# Patient Record
Sex: Male | Born: 2000 | Hispanic: Yes | Marital: Single | Smoking: Former smoker
Health system: Southern US, Community
[De-identification: ages and names within clinical notes are randomized; demographics above are authoritative.]

## PROBLEM LIST (undated history)

## (undated) HISTORY — PX: WRIST FRACTURE SURGERY: SHX121

## (undated) HISTORY — PX: ELBOW SURGERY: SHX618

---
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
## 2023-07-19 ENCOUNTER — Emergency Department (HOSPITAL_COMMUNITY): Payer: Self-pay | Admitting: Anesthesiology

## 2023-07-19 ENCOUNTER — Inpatient Hospital Stay: Admit: 2023-07-19 | Payer: Self-pay | Admitting: Surgery

## 2023-07-19 ENCOUNTER — Encounter (HOSPITAL_COMMUNITY): Payer: Self-pay | Admitting: General Surgery

## 2023-07-19 ENCOUNTER — Other Ambulatory Visit: Payer: Self-pay

## 2023-07-19 ENCOUNTER — Emergency Department (HOSPITAL_COMMUNITY): Payer: Self-pay

## 2023-07-19 ENCOUNTER — Inpatient Hospital Stay (HOSPITAL_COMMUNITY)
Admission: EM | Admit: 2023-07-19 | Discharge: 2023-07-23 | DRG: 399 | Disposition: A | Discharge status: Home / Self Care | Payer: Self-pay | Attending: Surgery | Admitting: Surgery

## 2023-07-19 ENCOUNTER — Encounter (HOSPITAL_COMMUNITY): Admission: EM | Disposition: A | Discharge status: Home / Self Care | Payer: Self-pay | Source: Home / Self Care

## 2023-07-19 DIAGNOSIS — K3580 Unspecified acute appendicitis: Secondary | ICD-10-CM | POA: Diagnosis present

## 2023-07-19 DIAGNOSIS — K381 Appendicular concretions: Secondary | ICD-10-CM | POA: Diagnosis present

## 2023-07-19 DIAGNOSIS — K352 Acute appendicitis with generalized peritonitis, without perforation or abscess: Principal | ICD-10-CM | POA: Diagnosis present

## 2023-07-19 DIAGNOSIS — R5082 Postprocedural fever: Secondary | ICD-10-CM | POA: Diagnosis not present

## 2023-07-19 HISTORY — PX: LAPAROSCOPIC APPENDECTOMY: SHX408

## 2023-07-19 LAB — COMPREHENSIVE METABOLIC PANEL
ALT: 23 U/L (ref 0–44)
AST: 23 U/L (ref 15–41)
Albumin: 4.8 g/dL (ref 3.5–5.0)
Alkaline Phosphatase: 83 U/L (ref 38–126)
Anion gap: 13 (ref 5–15)
BUN: 21 mg/dL — ABNORMAL HIGH (ref 6–20)
CO2: 25 mmol/L (ref 22–32)
Calcium: 9.7 mg/dL (ref 8.9–10.3)
Chloride: 97 mmol/L — ABNORMAL LOW (ref 98–111)
Creatinine, Ser: 1.2 mg/dL (ref 0.61–1.24)
GFR, Estimated: >60 mL/min (ref >60)
Glucose, Bld: 119 mg/dL — ABNORMAL HIGH (ref 70–99)
Potassium: 3.7 mmol/L (ref 3.5–5.1)
Sodium: 135 mmol/L (ref 135–145)
Total Bilirubin: 0.8 mg/dL (ref 0.0–1.2)
Total Protein: 9.2 g/dL — ABNORMAL HIGH (ref 6.5–8.1)

## 2023-07-19 LAB — LIPASE, BLOOD: Lipase: 50 U/L (ref 11–51)

## 2023-07-19 LAB — URINALYSIS, ROUTINE W REFLEX MICROSCOPIC
Bacteria, UA: NONE SEEN
Bilirubin Urine: NEGATIVE
Glucose, UA: NEGATIVE mg/dL
Hgb urine dipstick: NEGATIVE
Ketones, ur: 5 mg/dL — AB
Leukocytes,Ua: NEGATIVE
Nitrite: NEGATIVE
Protein, ur: >=300 mg/dL — AB
Specific Gravity, Urine: 1.031 — ABNORMAL HIGH (ref 1.005–1.030)
pH: 5 (ref 5.0–8.0)

## 2023-07-19 LAB — CBC
HCT: 44.9 % (ref 39.0–52.0)
Hemoglobin: 15.6 g/dL (ref 13.0–17.0)
MCH: 29.3 pg (ref 26.0–34.0)
MCHC: 34.7 g/dL (ref 30.0–36.0)
MCV: 84.4 fL (ref 80.0–100.0)
Platelets: 196 10*3/uL (ref 150–400)
RBC: 5.32 MIL/uL (ref 4.22–5.81)
RDW: 12.3 % (ref 11.5–15.5)
WBC: 12.2 10*3/uL — ABNORMAL HIGH (ref 4.0–10.5)
nRBC: 0 % (ref 0.0–0.2)

## 2023-07-19 SURGERY — APPENDECTOMY, LAPAROSCOPIC
Anesthesia: General

## 2023-07-19 MED ORDER — SODIUM CHLORIDE 0.45 % IV SOLN: INTRAVENOUS | Status: AC

## 2023-07-19 MED ORDER — PROPOFOL 10 MG/ML IV BOLUS
INTRAVENOUS | Status: DC | PRN
  Administered 2023-07-19: 200 mg via INTRAVENOUS

## 2023-07-19 MED ORDER — OXYCODONE HCL 5 MG/5ML PO SOLN: 5.0000 mg | Freq: Once | ORAL | Status: DC | PRN

## 2023-07-19 MED ORDER — KETOROLAC TROMETHAMINE 15 MG/ML IJ SOLN
INTRAMUSCULAR | Status: DC | PRN
  Administered 2023-07-19: 15 mg via INTRAVENOUS

## 2023-07-19 MED ORDER — KETAMINE HCL 50 MG/5ML IJ SOSY
PREFILLED_SYRINGE | INTRAMUSCULAR | Status: AC
  Filled 2023-07-19: qty 5

## 2023-07-19 MED ORDER — MIDAZOLAM HCL 5 MG/5ML IJ SOLN
INTRAMUSCULAR | Status: DC | PRN
  Administered 2023-07-19: 2 mg via INTRAVENOUS

## 2023-07-19 MED ORDER — PANTOPRAZOLE SODIUM 40 MG IV SOLR
40.0000 mg | Freq: Once | INTRAVENOUS | Status: AC
  Administered 2023-07-19: 40 mg via INTRAVENOUS
  Filled 2023-07-19: qty 10

## 2023-07-19 MED ORDER — FENTANYL CITRATE (PF) 100 MCG/2ML IJ SOLN
INTRAMUSCULAR | Status: AC
  Filled 2023-07-19: qty 2

## 2023-07-19 MED ORDER — ONDANSETRON HCL 4 MG/2ML IJ SOLN: 4.0000 mg | Freq: Four times a day (QID) | INTRAMUSCULAR | Status: DC | PRN

## 2023-07-19 MED ORDER — PROPOFOL 10 MG/ML IV BOLUS
INTRAVENOUS | Status: AC
Start: 2023-07-19 — End: ?
  Filled 2023-07-19: qty 20

## 2023-07-19 MED ORDER — STERILE WATER FOR IRRIGATION IR SOLN
Status: DC | PRN
  Administered 2023-07-19: 1000 mL

## 2023-07-19 MED ORDER — SUCCINYLCHOLINE CHLORIDE 200 MG/10ML IV SOSY
PREFILLED_SYRINGE | INTRAVENOUS | Status: DC | PRN
  Administered 2023-07-19: 120 mg via INTRAVENOUS

## 2023-07-19 MED ORDER — ACETAMINOPHEN 500 MG PO TABS
1000.0000 mg | ORAL_TABLET | Freq: Once | ORAL | Status: DC
  Filled 2023-07-19: qty 2

## 2023-07-19 MED ORDER — FENTANYL CITRATE PF 50 MCG/ML IJ SOSY
50.0000 ug | PREFILLED_SYRINGE | Freq: Once | INTRAMUSCULAR | Status: AC
  Administered 2023-07-19: 50 ug via INTRAVENOUS
  Filled 2023-07-19: qty 1

## 2023-07-19 MED ORDER — OXYCODONE HCL 5 MG PO TABS
5.0000 mg | ORAL_TABLET | ORAL | Status: DC | PRN
  Administered 2023-07-19: 5 mg via ORAL
  Administered 2023-07-19 – 2023-07-22 (×7): 10 mg via ORAL
  Filled 2023-07-19: qty 2
  Filled 2023-07-19: qty 1
  Filled 2023-07-19 (×7): qty 2

## 2023-07-19 MED ORDER — FENTANYL CITRATE (PF) 100 MCG/2ML IJ SOLN
INTRAMUSCULAR | Status: DC | PRN
  Administered 2023-07-19 (×4): 50 ug via INTRAVENOUS

## 2023-07-19 MED ORDER — HYDROMORPHONE HCL 1 MG/ML IJ SOLN
1.0000 mg | INTRAMUSCULAR | Status: DC | PRN
  Administered 2023-07-19 – 2023-07-21 (×6): 1 mg via INTRAVENOUS
  Filled 2023-07-19 (×6): qty 1

## 2023-07-19 MED ORDER — LIDOCAINE HCL (CARDIAC) PF 100 MG/5ML IV SOSY
PREFILLED_SYRINGE | INTRAVENOUS | Status: DC | PRN
  Administered 2023-07-19: 100 mg via INTRAVENOUS

## 2023-07-19 MED ORDER — DEXAMETHASONE SODIUM PHOSPHATE 10 MG/ML IJ SOLN
INTRAMUSCULAR | Status: DC | PRN
  Administered 2023-07-19: 10 mg via INTRAVENOUS

## 2023-07-19 MED ORDER — ACETAMINOPHEN 10 MG/ML IV SOLN
INTRAVENOUS | Status: DC | PRN
  Administered 2023-07-19: 1000 mg via INTRAVENOUS

## 2023-07-19 MED ORDER — PIPERACILLIN-TAZOBACTAM 3.375 G IVPB 30 MIN
3.3750 g | Freq: Once | INTRAVENOUS | Status: AC
  Administered 2023-07-19: 3.375 g via INTRAVENOUS
  Filled 2023-07-19: qty 50

## 2023-07-19 MED ORDER — ACETAMINOPHEN 325 MG PO TABS
650.0000 mg | ORAL_TABLET | Freq: Four times a day (QID) | ORAL | Status: DC | PRN
  Administered 2023-07-21 (×2): 650 mg via ORAL
  Filled 2023-07-19 (×2): qty 2

## 2023-07-19 MED ORDER — IOHEXOL 300 MG/ML  SOLN
100.0000 mL | Freq: Once | INTRAMUSCULAR | Status: AC | PRN
  Administered 2023-07-19: 100 mL via INTRAVENOUS

## 2023-07-19 MED ORDER — PIPERACILLIN-TAZOBACTAM 3.375 G IVPB
3.3750 g | Freq: Three times a day (TID) | INTRAVENOUS | Status: DC
  Administered 2023-07-19 – 2023-07-23 (×11): 3.375 g via INTRAVENOUS
  Filled 2023-07-19 (×11): qty 50

## 2023-07-19 MED ORDER — PROPOFOL 10 MG/ML IV BOLUS
INTRAVENOUS | Status: AC
  Filled 2023-07-19: qty 20

## 2023-07-19 MED ORDER — OXYCODONE HCL 5 MG PO TABS: 5.0000 mg | ORAL_TABLET | Freq: Once | ORAL | Status: DC | PRN

## 2023-07-19 MED ORDER — BUPIVACAINE-EPINEPHRINE 0.25% -1:200000 IJ SOLN
INTRAMUSCULAR | Status: AC
  Filled 2023-07-19: qty 1

## 2023-07-19 MED ORDER — LACTATED RINGERS IV SOLN: INTRAVENOUS | Status: DC

## 2023-07-19 MED ORDER — FENTANYL CITRATE PF 50 MCG/ML IJ SOSY
25.0000 ug | PREFILLED_SYRINGE | INTRAMUSCULAR | Status: DC | PRN
  Administered 2023-07-19: 25 ug via INTRAVENOUS
  Administered 2023-07-19: 50 ug via INTRAVENOUS

## 2023-07-19 MED ORDER — KETAMINE HCL 10 MG/ML IJ SOLN
INTRAMUSCULAR | Status: DC | PRN
  Administered 2023-07-19: 40 mg via INTRAVENOUS

## 2023-07-19 MED ORDER — ROCURONIUM BROMIDE 10 MG/ML (PF) SYRINGE
PREFILLED_SYRINGE | INTRAVENOUS | Status: AC
  Filled 2023-07-19: qty 10

## 2023-07-19 MED ORDER — SUGAMMADEX SODIUM 200 MG/2ML IV SOLN
INTRAVENOUS | Status: DC | PRN
  Administered 2023-07-19: 200 mg via INTRAVENOUS

## 2023-07-19 MED ORDER — FENTANYL CITRATE PF 50 MCG/ML IJ SOSY
PREFILLED_SYRINGE | INTRAMUSCULAR | Status: AC
  Filled 2023-07-19: qty 1

## 2023-07-19 MED ORDER — ONDANSETRON HCL 4 MG/2ML IJ SOLN
INTRAMUSCULAR | Status: DC | PRN
  Administered 2023-07-19: 4 mg via INTRAVENOUS

## 2023-07-19 MED ORDER — ALBUMIN HUMAN 5 % IV SOLN: INTRAVENOUS | Status: DC | PRN

## 2023-07-19 MED ORDER — ROCURONIUM BROMIDE 100 MG/10ML IV SOLN
INTRAVENOUS | Status: DC | PRN
  Administered 2023-07-19: 50 mg via INTRAVENOUS

## 2023-07-19 MED ORDER — TRAMADOL HCL 50 MG PO TABS: 50.0000 mg | ORAL_TABLET | Freq: Four times a day (QID) | ORAL | Status: DC | PRN

## 2023-07-19 MED ORDER — ONDANSETRON HCL 4 MG/2ML IJ SOLN
4.0000 mg | Freq: Once | INTRAMUSCULAR | Status: AC
Start: 2023-07-19 — End: 2023-07-19
  Administered 2023-07-19: 4 mg via INTRAVENOUS
  Filled 2023-07-19: qty 2

## 2023-07-19 MED ORDER — ONDANSETRON 4 MG PO TBDP: 4.0000 mg | ORAL_TABLET | Freq: Four times a day (QID) | ORAL | Status: DC | PRN

## 2023-07-19 MED ORDER — HYDROMORPHONE HCL 2 MG/ML IJ SOLN
INTRAMUSCULAR | Status: AC
  Filled 2023-07-19: qty 1

## 2023-07-19 MED ORDER — LIDOCAINE HCL (PF) 2 % IJ SOLN
INTRAMUSCULAR | Status: AC
  Filled 2023-07-19: qty 5

## 2023-07-19 MED ORDER — ACETAMINOPHEN 650 MG RE SUPP: 650.0000 mg | Freq: Four times a day (QID) | RECTAL | Status: DC | PRN

## 2023-07-19 MED ORDER — PIPERACILLIN-TAZOBACTAM 3.375 G IVPB: 3.3750 g | Freq: Three times a day (TID) | INTRAVENOUS | Status: DC

## 2023-07-19 MED ORDER — PHENYLEPHRINE 80 MCG/ML (10ML) SYRINGE FOR IV PUSH (FOR BLOOD PRESSURE SUPPORT)
PREFILLED_SYRINGE | INTRAVENOUS | Status: DC | PRN
  Administered 2023-07-19 (×2): 120 ug via INTRAVENOUS

## 2023-07-19 MED ORDER — ACETAMINOPHEN 10 MG/ML IV SOLN
INTRAVENOUS | Status: AC
  Filled 2023-07-19: qty 100

## 2023-07-19 MED ORDER — HYDROMORPHONE HCL 1 MG/ML IJ SOLN
INTRAMUSCULAR | Status: DC | PRN
Start: 2023-07-19 — End: 2023-07-19
  Administered 2023-07-19 (×3): .2 mg via INTRAVENOUS
  Administered 2023-07-19: .4 mg via INTRAVENOUS
  Administered 2023-07-19: .2 mg via INTRAVENOUS

## 2023-07-19 MED ORDER — MIDAZOLAM HCL 2 MG/2ML IJ SOLN
INTRAMUSCULAR | Status: AC
  Filled 2023-07-19: qty 2

## 2023-07-19 MED ORDER — DROPERIDOL 2.5 MG/ML IJ SOLN: 0.6250 mg | Freq: Once | INTRAMUSCULAR | Status: DC | PRN

## 2023-07-19 MED ORDER — DEXMEDETOMIDINE HCL IN NACL 80 MCG/20ML IV SOLN
INTRAVENOUS | Status: DC | PRN
  Administered 2023-07-19: 8 ug via INTRAVENOUS

## 2023-07-19 MED ORDER — 0.9 % SODIUM CHLORIDE (POUR BTL) OPTIME
TOPICAL | Status: DC | PRN
  Administered 2023-07-19: 1000 mL

## 2023-07-19 MED ORDER — CHLORHEXIDINE GLUCONATE 0.12 % MT SOLN
15.0000 mL | Freq: Once | OROMUCOSAL | Status: AC
  Administered 2023-07-19: 15 mL via OROMUCOSAL

## 2023-07-19 SURGICAL SUPPLY — 33 items
BAG COUNTER SPONGE SURGICOUNT (BAG) IMPLANT
CHLORAPREP W/TINT 26 (MISCELLANEOUS) ×1 IMPLANT
COVER SURGICAL LIGHT HANDLE (MISCELLANEOUS) ×1 IMPLANT
CUTTER FLEX LINEAR 45M (STAPLE) IMPLANT
DERMABOND ADVANCED .7 DNX12 (GAUZE/BANDAGES/DRESSINGS) ×1 IMPLANT
DRSG OPSITE POSTOP 4X6 (GAUZE/BANDAGES/DRESSINGS) IMPLANT
ELECT PENCIL ROCKER SW 15FT (MISCELLANEOUS) IMPLANT
ELECT REM PT RETURN 15FT ADLT (MISCELLANEOUS) ×1 IMPLANT
GLOVE SURG ORTHO 8.0 STRL STRW (GLOVE) ×1 IMPLANT
GOWN STRL REUS W/ TWL XL LVL3 (GOWN DISPOSABLE) ×2 IMPLANT
IRRIG SUCT STRYKERFLOW 2 WTIP (MISCELLANEOUS) ×1 IMPLANT
IRRIGATION SUCT STRKRFLW 2 WTP (MISCELLANEOUS) ×1 IMPLANT
KIT BASIN OR (CUSTOM PROCEDURE TRAY) ×1 IMPLANT
KIT TURNOVER KIT A (KITS) IMPLANT
RELOAD 45 VASCULAR/THIN (ENDOMECHANICALS) IMPLANT
RELOAD STAPLE 45 2.5 WHT GRN (ENDOMECHANICALS) IMPLANT
RELOAD STAPLE 45 3.5 BLU ETS (ENDOMECHANICALS) IMPLANT
RELOAD STAPLE TA45 3.5 REG BLU (ENDOMECHANICALS) IMPLANT
SET TUBE SMOKE EVAC HIGH FLOW (TUBING) ×1 IMPLANT
SHEARS HARMONIC 36 ACE (MISCELLANEOUS) ×1 IMPLANT
SPIKE FLUID TRANSFER (MISCELLANEOUS) ×1 IMPLANT
STAPLER PROXIMATE 75MM BLUE (STAPLE) IMPLANT
SUT MNCRL AB 4-0 PS2 18 (SUTURE) ×1 IMPLANT
SUT SILK 2 0 SH CR/8 (SUTURE) IMPLANT
SUT SILK 3 0 SH CR/8 (SUTURE) IMPLANT
SYS BAG RETRIEVAL 10MM (BASKET) ×1 IMPLANT
SYSTEM BAG RETRIEVAL 10MM (BASKET) ×1 IMPLANT
TOWEL OR 17X26 10 PK STRL BLUE (TOWEL DISPOSABLE) ×1 IMPLANT
TRAY LAPAROSCOPIC (CUSTOM PROCEDURE TRAY) ×1 IMPLANT
TROCAR 11X100 Z THREAD (TROCAR) ×1 IMPLANT
TROCAR BALLN 12MMX100 BLUNT (TROCAR) ×1 IMPLANT
TROCAR Z-THREAD OPTICAL 5X100M (TROCAR) ×1 IMPLANT
YANKAUER SUCT BULB TIP 10FT TU (MISCELLANEOUS) IMPLANT

## 2023-07-19 NOTE — Discharge Instructions (Addendum)
 [facility]      [facility], [place] ?[phone] ? ?OPEN ABDOMINAL SURGERY: POST OP INSTRUCTIONS ? ?Always review your discharge instruction sheet given to you by the facility where your surgery was performed. ? ?IF YOU HAVE DISABILITY OR FAMILY LEAVE FORMS, YOU MUST BRING THEM TO THE OFFICE FOR PROCESSING.  PLEASE DO NOT GIVE THEM TO YOUR DOCTOR. ? ?A prescription for pain medication may be given to you upon discharge.  Take your pain medication as prescribed, if needed.  If narcotic pain medicine is not needed, then you may take acetaminophen (Tylenol) as needed. ?Take your usually prescribed medications unless otherwise directed. ?If you need a refill on your pain medication, please contact your pharmacy. They will contact our office to request authorization.  Prescriptions will not be filled after 5pm or on week-ends. ?You should follow a light diet the first few days after arrival home, such as soup and crackers, pudding, etc.unless your doctor has advised otherwise. A high-fiber, low fat diet can be resumed as tolerated.   Be sure to include lots of fluids daily. Most patients will experience some swelling and bruising on the chest and neck area.  Ice packs will help.  Swelling and bruising can take several days to resolve ?Most patients will experience some swelling and bruising in the area of the incision. Ice pack will help. Swelling and bruising can take several days to resolve.Marland Kitchen  ?It is common to experience some constipation if taking pain medication after surgery.  Increasing fluid intake and taking a stool softener will usually help or prevent this problem from occurring.  A mild laxative (Milk of Magnesia or Miralax) should be taken according to package directions if there are no bowel movements after 48 hours. ? You may have steri-strips (small skin tapes) in place directly over the incision.  These strips should be left on the skin for 7-10 days.  If your surgeon used skin glue on the incision,  you may shower in 24 hours.  The glue will flake off over the next 2-3 weeks.  Any sutures or staples will be removed at the office during your follow-up visit. You may find that a light gauze bandage over your incision may keep your staples from being rubbed or pulled. You may shower and replace the bandage daily. ?ACTIVITIES:  You may resume regular (light) daily activities beginning the next day--such as daily self-care, walking, climbing stairs--gradually increasing activities as tolerated.  You may have sexual intercourse when it is comfortable.  Refrain from any heavy lifting or straining until approved by your doctor. ?You may drive when you no longer are taking prescription pain medication, you can comfortably wear a seatbelt, and you can safely maneuver your car and apply brakes ?Return to Work: ___________________________________ ?You should see your doctor in the office for a follow-up appointment approximately two weeks after your surgery.  Make sure that you call for this appointment within a day or two after you arrive home to insure a convenient appointment time. ?OTHER INSTRUCTIONS:  ?_____________________________________________________________ ?_____________________________________________________________ ? ?WHEN TO CALL YOUR DOCTOR: ?Fever over 101.0 ?Inability to urinate ?Nausea and/or vomiting ?Extreme swelling or bruising ?Continued bleeding from incision. ?Increased pain, redness, or drainage from the incision. ?Difficulty swallowing or breathing ?Muscle cramping or spasms. ?Numbness or tingling in hands or feet or around lips. ? ?The clinic staff is available to answer your questions during regular business hours.  Please don?t hesitate to call and ask to speak to one of the nurses if  you have concerns. ? ?For further questions, please visit [email or web address] ? ?

## 2023-07-19 NOTE — Plan of Care (Signed)

## 2023-07-19 NOTE — ED Notes (Addendum)
 Pt is being transported to surgery by w/c, family is not in waiting room, if they arrive they will be directed to surgical waiting.

## 2023-07-19 NOTE — Transfer of Care (Signed)
 Immediate Anesthesia Transfer of Care Note  Patient: Johnathan Leonard  Procedure(s) Performed: APPENDECTOMY LAPAROSCOPIC  Patient Location: PACU  Anesthesia Type:General  Level of Consciousness: sedated  Airway & Oxygen Therapy: Patient Spontanous Breathing and Patient connected to face mask  Post-op Assessment: Report given to RN  Post vital signs: Reviewed and stable  Last Vitals:  Vitals Value Taken Time  BP 137/63 07/19/23 1445  Temp    Pulse 70 07/19/23 1447  Resp 25 07/19/23 1447  SpO2 100 % 07/19/23 1447  Vitals shown include unfiled device data.  Last Pain:  Vitals:   07/19/23 1145  TempSrc:   PainSc: 4       Patients Stated Pain Goal: 5 (07/19/23 1109)  Complications: No notable events documented.

## 2023-07-19 NOTE — ED Notes (Signed)
 Pt has been NPO since 11pm last night. Pt presently removing clothes and putting gown on. He is aware surgery is on the way to transport him.

## 2023-07-19 NOTE — Op Note (Signed)
 Operative Note  Pre-operative Diagnosis: Acute appendicitis  Post-operative Diagnosis: Same  Surgeon:  Krystal Spinner, MD  Assistant: None   Procedure: Attempted laparoscopic appendectomy, conversion to open appendectomy  Anesthesia: General  Estimated Blood Loss: 10 cc  Drains: None         Specimen: Appendix to pathology  Indications: Patient is a 23 year old Hispanic male who presented to the emergency department with abdominal pain.  Evaluation included a CT scan showing findings consistent with acute appendicitis with multiple appendicoliths.  Anatomy was somewhat unusual as the cecum was high in the right upper quadrant.  Patient was brought to surgery for appendectomy.  Procedure:  The patient was seen in the pre-op holding area. The risks, benefits, complications, treatment options, and expected outcomes were previously discussed with the patient. The patient agreed with the proposed plan and has signed the informed consent form.  The patient was brought to the operating room by the surgical team, identified as Hezzie Jo and the procedure verified. A time out was completed and the above information confirmed.  Following administration of general anesthesia, the patient is positioned and then prepped and draped in the usual aseptic fashion.  After ascertaining that an adequate level of anesthesia been achieved, an incision is made in the midline just above the umbilicus.  Dissection was carried down to the fascia and the fascia was incised in the midline.  The peritoneal cavity is entered cautiously.  0 Vicryl pursestring suture was placed in the fascia.  An assigned cannula is introduced under direct vision and secured with the pursestring suture.  Abdomen is insufflated with carbon dioxide.  Laparoscope was introduced.  The cecum is unusually high in the right upper quadrant.  Operative ports are placed along the right costal margin and at the left lower quadrant of the abdomen.   The cecum is gently manipulated.  The base of the appendix is identified.  However the appendix goes retrocecally almost directly posteriorly.  Attempts at mobilization do not allow for additional visualization as the terminal ileum is also very closely approximated to the appendix.  Attempts at mobilizing the cecum do not appear to permit visualization or mobilization of the appendix.  Therefore decision is made to convert to open surgery.  The port site just above the umbilicus is then extended with a #10 blade cephalad in the midline.  The electrocautery was used for hemostasis.  Dissection is carried through the subcutaneous tissues to the fascia.  Midline fascia is divided with the electrocautery and the peritoneum is widely entered through the midline incision.  Exploration allows for retraction of the cecum medially and superiorly.  Base of the appendix is visualized.  Palpation shows that the appendix does go nearly directly posteriorly to the retroperitoneum.  It is inflamed and indurated but not perforated.  It appears to contain multiple appendicoliths.  It is densely adherent to the retroperitoneum.  A window was created at the base of the appendix at the junction with the cecal wall.  Using a GIA stapler the base of the appendix is transected.  Using the harmonic scalpel the mesoappendix is taken down carefully following the appendix as it tracks posteriorly to the retroperitoneum.  Both sides of the appendix are mobilized using the harmonic scalpel.  Care is taken to avoid perforation.  Dissection was carried around the tip of the appendix and the entire appendix is excised and submitted to pathology for review.  The area where the appendix lay in the retroperitoneum is then  copiously irrigated with warm saline which is evacuated.  Good hemostasis is noted.  The base of the cecum is also irrigated with warm saline and good hemostasis is noted at the staple line on the cecal wall.  Cecum is  returned to the right upper quadrant and covered with omentum.  Midline incision is then closed with running #1 PDS suture.  Subcutaneous tissues are irrigated.  Skin incisions are closed with stainless steel staples.  All wounds are washed and dried.  Dressings are applied.  Patient is awakened from anesthesia and transferred to the recovery room.  The patient tolerated the procedure well.   Krystal Spinner, MD [facility] Office: [phone]

## 2023-07-19 NOTE — ED Provider Notes (Signed)
 [place] EMERGENC[facility] DEPARTMENT AT [facility] Provider Note   CSN: [id number] Arrival date & time: 07/19/23  9761     History  Chief Complaint  Patient presents with   Abdominal Pain    Johnathan Leonard is a 23 y.o. male who presents to ED concerned for epigastric pain for approx 20 hours. Patient also with nausea, vomiting, and diarrhea. Last vomited at 3AM. Also with intermittent coughing. Also states that he might have seen a little bit of blood in his vomit yesterday.  Denies chest pain, dyspnea, dysuria, hematuria, hematochezia.    Abdominal Pain      Home Medications Prior to Admission medications   Medication Sig Start Date End Date Taking? Authorizing Provider  HYDROcodone -acetaminophen  (NORCO/VICODIN) 5-325 MG per tablet Take 1-2 tablets by mouth every 4 (four) hours as needed. 10/20/14   Odell Balls, PA-C  ibuprofen  (ADVIL ,MOTRIN ) 600 MG tablet Take 1 tablet (600 mg total) by mouth every 6 (six) hours as needed. 10/20/14   Odell Balls, PA-C      Allergies    Patient has no known allergies.    Review of Systems   Review of Systems  Gastrointestinal:  Positive for abdominal pain.    Physical Exam Updated Vital Signs BP 127/62   Pulse 86   Temp 98.6 F (37 C) (Oral)   Resp 19   SpO2 99%  Physical Exam Vitals and nursing note reviewed.  Constitutional:      General: He is not in acute distress.    Appearance: He is not ill-appearing or toxic-appearing.  HENT:     Head: Normocephalic and atraumatic.     Mouth/Throat:     Mouth: Mucous membranes are moist.     Pharynx: No posterior oropharyngeal erythema.  Eyes:     General: No scleral icterus.       Right eye: No discharge.        Left eye: No discharge.     Conjunctiva/sclera: Conjunctivae normal.  Cardiovascular:     Rate and Rhythm: Normal rate and regular rhythm.     Pulses: Normal pulses.     Heart sounds: No murmur heard. Pulmonary:     Effort: Pulmonary effort is normal. No  respiratory distress.     Breath sounds: Normal breath sounds. No wheezing, rhonchi or rales.  Abdominal:     General: Abdomen is flat. Bowel sounds are normal. There is no distension.     Palpations: Abdomen is soft. There is no mass.     Tenderness: There is generalized abdominal tenderness.  Musculoskeletal:     Right lower leg: No edema.     Left lower leg: No edema.  Skin:    General: Skin is warm and dry.     Findings: No rash.  Neurological:     General: No focal deficit present.     Mental Status: He is alert and oriented to person, place, and time. Mental status is at baseline.  Psychiatric:        Mood and Affect: Mood normal.     ED Results / Procedures / Treatments   Labs (all labs ordered are listed, but only abnormal results are displayed) Labs Reviewed  COMPREHENSIVE METABOLIC PANEL - Abnormal; Notable for the following components:      Result Value   Chloride 97 (*)    Glucose, Bld 119 (*)    BUN 21 (*)    Total Protein 9.2 (*)    All other components within normal limits  CBC - Abnormal; Notable for the following components:   WBC 12.2 (*)    All other components within normal limits  URINALYSIS, ROUTINE W REFLEX MICROSCOPIC - Abnormal; Notable for the following components:   APPearance HAZY (*)    Specific Gravity, Urine 1.031 (*)    Ketones, ur 5 (*)    Protein, ur >=300 (*)    All other components within normal limits  LIPASE, BLOOD    EKG None  Radiology CT ABDOMEN PELVIS W CONTRAST Result Date: 07/19/2023 CLINICAL DATA:  23 year old male with abdominal pain, nausea vomiting since yesterday. EXAM: CT ABDOMEN AND PELVIS WITH CONTRAST TECHNIQUE: Multidetector CT imaging of the abdomen and pelvis was performed using the standard protocol following bolus administration of intravenous contrast. RADIATION DOSE REDUCTION: This exam was performed according to the departmental dose-optimization program which includes automated exposure control, adjustment of  the mA and/or kV according to patient size and/or use of iterative reconstruction technique. CONTRAST:  OMNIPAQUE  IOHEXOL  300 MG/ML  SOLN COMPARISON:  None Available. FINDINGS: Lower chest: Heart size is at the upper limits of normal. Otherwise negative visible lower chest. Hepatobiliary: Negative liver and gallbladder. Pancreas: Negative. Spleen: Negative. Adrenals/Urinary Tract: Normal adrenal glands. Right kidney is abutted by the abnormal appendix but remains normal. Left kidney and urinary bladder appear normal. Ureters are decompressed. Stomach/Bowel: Decompressed and negative large bowel from the splenic flexure distally. Small volume layering fluid in the transverse colon. Mostly stool in the right colon. Cecum on a slightly lax mesentery located in the right upper abdomen at the gutter. Appendix arises from the cecum on coronal image 65. Appendix: Location: Medial to the cecum in the right upper abdomen along the anterior right pararenal space, abutting the kidney. Diameter: Distally up to 13 mm, abnormal. Appendicolith: Multiple. But it appears that a large 10 mm appendicoliths seen on series 2, image 39 and coronal image 56 is obstructing the distal appendix which is inflamed. The base of the appendix remains diminutive and normal. Mucosal hyper-enhancement: Mild Extraluminal gas: Negative. Periappendiceal collection: Negative. Terminal ileum is decompressed. No dilated small bowel. Stomach is decompressed. The proximal duodenum is fluid distended but tapers. This is near the abnormal appendix. No free air or free fluid identified. Vascular/Lymphatic: Major arterial structures and the portal venous system in the abdomen and pelvis appear patent and within normal limits. No lymphadenopathy identified. Reproductive: Negative. Other: No pelvis free fluid. Musculoskeletal: Negative. IMPRESSION: 1. Acute Appendicitis: multiple appendicoliths, with a large 10 mm stone obstructing the proximal appendix;  distal appendix is dilated and inflamed. Note unusual location of the appendix along the anterior right pararenal space, abutting the right kidney. No evidence of perforation or abscess. 2. Right kidney remains normal. No other acute or inflammatory process identified in the abdomen or pelvis. Electronically Signed   By: VEAR Hurst M.D.   On: 07/19/2023 07:39    Procedures .Critical Care  Performed by: Hoy Nidia FALCON, PA-C Authorized by: Hoy Nidia FALCON, PA-C   Critical care provider statement:    Critical care was necessary to treat or prevent imminent or life-threatening deterioration of the following conditions:  Toxidrome   Critical care was time spent personally by me on the following activities:  Review of old charts, re-evaluation of patient's condition, pulse oximetry, ordering and review of radiographic studies, ordering and review of laboratory studies, ordering and performing treatments and interventions, development of treatment plan with patient or surrogate, discussions with consultants, evaluation of patient's response to treatment, examination of  patient and obtaining history from patient or surrogate   Care discussed with: admitting provider   Comments:     appendicitis     Medications Ordered in ED Medications  piperacillin -tazobactam (ZOSYN ) IVPB 3.375 g (has no administration in time range)  piperacillin -tazobactam (ZOSYN ) IVPB 3.375 g (has no administration in time range)  ondansetron  (ZOFRAN ) injection 4 mg (4 mg Intravenous Given 07/19/23 0649)  fentaNYL  (SUBLIMAZE ) injection 50 mcg (50 mcg Intravenous Given 07/19/23 0747)  iohexol  (OMNIPAQUE ) 300 MG/ML solution 100 mL (100 mLs Intravenous Contrast Given 07/19/23 0713)  pantoprazole  (PROTONIX ) injection 40 mg (40 mg Intravenous Given 07/19/23 0746)    ED Course/ Medical Decision Making/ A&P                                 Medical Decision Making Amount and/or Complexity of Data Reviewed Labs:  ordered. Radiology: ordered.  Risk Prescription drug management.    This patient presents to the ED for concern of abdominal pain, this involves an extensive number of treatment options, and is a complaint that carries with it a high risk of complications and morbidity.  The differential diagnosis includes gastroenteritis, colitis, small bowel obstruction, appendicitis, cholecystitis, pancreatitis, nephrolithiasis, UTI, pyleonephritis, testicular torsion.   Co morbidities that complicate the patient evaluation  none   Problem List / ED Course / Critical interventions / Medication management  Patient presented for epigastric abdominal pain radiating throughout entire abdomen.  Physical exam with generalized tenderness to palpation of his abdomen.  Patient afebrile with stable vitals. I Ordered, and personally interpreted labs.  CBC with leukocytosis of 12.2.  No anemia.  Lipase within normal limits.  CMP reassuring.  UA not concerning for infection. I ordered imaging studies including CT Abd/Pelvis with contrast: evaluate for structural/surgical etiology of patients' severe abdominal pain. I independently visualized and interpreted imaging and I agree with the radiologist interpretation of appendicitis. Provided patient with fentanyl  and Zofran  for symptom management. Also provided patient with IV Protonix  given the small amount of blood in his vomit that he noticed earlier today. No vomiting since 3AM. Ordered Zosyn  once CT scan confirmed appendicitis. I have reviewed the patients home medicines and have made adjustments as needed Consulted with general surgeon on-call PA Glendale Mais who recommends OR for emergent surgery. Possible discharge afterwards. I appreciate their help.   Social Determinants of Health:  none          Final Clinical Impression(s) / ED Diagnoses Final diagnoses:  Acute appendicitis with generalized peritonitis without gangrene, perforation, or abscess     Rx / DC Orders ED Discharge Orders     None         Hoy Nidia FALCON, [place] 07/19/23 9171    Nicholaus Cassondra DEL, MD 07/20/23 [phone]

## 2023-07-19 NOTE — Anesthesia Preprocedure Evaluation (Addendum)
 Anesthesia Evaluation  Patient identified by MRN, date of birth, ID band Patient awake    Reviewed: Allergy & Precautions, NPO status , Patient's Chart, lab work & pertinent test results  History of Anesthesia Complications Negative for: history of anesthetic complications  Airway Mallampati: II  TM Distance: >3 FB Neck ROM: Full    Dental no notable dental hx.    Pulmonary former smoker   Pulmonary exam normal        Cardiovascular negative cardio ROS Normal cardiovascular exam     Neuro/Psych negative neurological ROS     GI/Hepatic Neg liver ROS,,,acute appendicitis   Endo/Other  negative endocrine ROS    Renal/GU negative Renal ROS     Musculoskeletal negative musculoskeletal ROS (+)    Abdominal   Peds  Hematology negative hematology ROS (+)   Anesthesia Other Findings Day of surgery medications reviewed with patient.  Reproductive/Obstetrics                             Anesthesia Physical Anesthesia Plan  ASA: 1 and emergent  Anesthesia Plan: General   Post-op Pain Management: Tylenol  PO (pre-op)*   Induction: Intravenous and Rapid sequence  PONV Risk Score and Plan: 3 and Treatment may vary due to age or medical condition, Midazolam , Dexamethasone  and Ondansetron   Airway Management Planned: Oral ETT  Additional Equipment: None  Intra-op Plan:   Post-operative Plan: Extubation in OR  Informed Consent: I have reviewed the patients History and Physical, chart, labs and discussed the procedure including the risks, benefits and alternatives for the proposed anesthesia with the patient or authorized representative who has indicated his/her understanding and acceptance.     Dental advisory given  Plan Discussed with: CRNA  Anesthesia Plan Comments:        Anesthesia Quick Evaluation

## 2023-07-19 NOTE — ED Triage Notes (Signed)
 Patient arrived with complaints of generalized abdominal pain, N/V since yesterday.

## 2023-07-19 NOTE — H&P (Signed)
 History and Physical Exam  Johnathan Leonard 03/27/2001  [id number].    Requesting MD: Dr. Nicholaus Chief Complaint/Reason for Consult: appendicitis  HPI:  23 y.o. male without significant medical history who presented to [facility] ED with abdominal pain. Pain began yesterday around noon in the epigastrium. He has had associated subjective fever and nausea and vomiting. He thinks there may have been some blood in his emesis yesterday. Pain has worsened since onset and is now all over but greatest in epigastrium and right abdomen. He has not eaten since prior to pain onset.  Substance use: none Allergies: NKDA Blood thinners: none Past Surgeries: none Works in holiday representative   ROS: Reviewed and as above  No family history on file.  History reviewed. No pertinent past medical history.  Past Surgical History:  Procedure Laterality Date   ELBOW SURGERY Right     Social History:  has no history on file for tobacco use, alcohol use, and drug use.  Allergies: No Known Allergies  (Not in a hospital admission)   Blood pressure 129/65, pulse 78, temperature 98.6 F (37 C), temperature source Oral, resp. rate 16, SpO2 96%. Physical Exam: General: pleasant, WD, male who is laying in bed in NAD HEENT: head is normocephalic, atraumatic.  Sclera are noninjected.  Pupils equal and round. EOMs intact.  Ears and nose without any masses or lesions.  Mouth is pink and moist Heart: Palpable radial pulses bilaterally Lungs:   Respiratory effort nonlabored Abd: soft, ND, no masses, hernias, or organomegaly, TTP in epigastrium and right mid abdomen MSK: all 4 extremities are symmetrical with no cyanosis, clubbing, or edema. Skin: warm and dry with no masses, lesions, or rashes Neuro: Cranial nerves 2-12 grossly intact, sensation is normal throughout Psych: A&Ox3 with an appropriate affect.    Results for orders placed or performed during the hospital encounter of 07/19/23 (from the past 48 hours)   Lipase, blood     Status: None   Collection Time: 07/19/23  4:08 AM  Result Value Ref Range   Lipase 50 11 - 51 U/L    Comment: Performed at [facility], [address] [place], [place] [address]  Comprehensive metabolic panel     Status: Abnormal   Collection Time: 07/19/23  4:08 AM  Result Value Ref Range   Sodium 135 135 - 145 mmol/L   Potassium 3.7 3.5 - 5.1 mmol/L   Chloride 97 (L) 98 - 111 mmol/L   CO2 25 22 - 32 mmol/L   Glucose, Bld 119 (H) 70 - 99 mg/dL    Comment: Glucose reference range applies only to samples taken after fasting for at least 8 hours.   BUN 21 (H) 6 - 20 mg/dL   Creatinine, Ser 8.79 0.61 - 1.24 mg/dL   Calcium 9.7 8.9 - 89.6 mg/dL   Total Protein 9.2 (H) 6.5 - 8.1 g/dL   Albumin  4.8 3.5 - 5.0 g/dL   AST 23 15 - 41 U/L   ALT 23 0 - 44 U/L   Alkaline Phosphatase 83 38 - 126 U/L   Total Bilirubin 0.8 0.0 - 1.2 mg/dL   GFR, Estimated >39 >39 mL/min    Comment: (NOTE) Calculated using the CKD-EPI Creatinine Equation (2021)    Anion gap 13 5 - 15    Comment: Performed at [facility], [address], [place], [place] [address]  CBC     Status: Abnormal   Collection Time: 07/19/23  4:08 AM  Result Value Ref Range   WBC 12.2 (H) 4.0 - 10.5 K/uL   RBC 5.32 4.22 - 5.81 MIL/uL   Hemoglobin 15.6 13.0 - 17.0 g/dL   HCT 55.0 60.9 - 47.9 %   MCV 84.4 80.0 - 100.0 fL   MCH 29.3 26.0 - 34.0 pg   MCHC 34.7 30.0 - 36.0 g/dL   RDW 87.6 88.4 - 84.4 %   Platelets 196 150 - 400 K/uL   nRBC 0.0 0.0 - 0.2 %    Comment: Performed at [facility], [address] [place], [place] [address]  Urinalysis, Routine w reflex microscopic -Urine, Clean Catch     Status: Abnormal   Collection Time: 07/19/23  4:12 AM  Result Value Ref Range   Color, Urine YELLOW YELLOW   APPearance HAZY (A) CLEAR   Specific Gravity, Urine 1.031 (H) 1.005 - 1.030   pH 5.0 5.0 - 8.0   Glucose, UA NEGATIVE NEGATIVE mg/dL   Hgb urine  dipstick NEGATIVE NEGATIVE   Bilirubin Urine NEGATIVE NEGATIVE   Ketones, ur 5 (A) NEGATIVE mg/dL   Protein, ur >=699 (A) NEGATIVE mg/dL   Nitrite NEGATIVE NEGATIVE   Leukocytes,Ua NEGATIVE NEGATIVE   RBC / HPF 0-5 0 - 5 RBC/hpf   WBC, UA 0-5 0 - 5 WBC/hpf   Bacteria, UA NONE SEEN NONE SEEN   Squamous Epithelial / HPF 0-5 0 - 5 /HPF   Mucus PRESENT     Comment: Performed at [facility], [address], [place], [place] [address]   CT ABDOMEN PELVIS W CONTRAST Result Date: 07/19/2023 CLINICAL DATA:  23 year old male with abdominal pain, nausea vomiting since yesterday. EXAM: CT ABDOMEN AND PELVIS WITH CONTRAST TECHNIQUE: Multidetector CT imaging of the abdomen and pelvis was performed using the standard protocol following bolus administration of intravenous contrast. RADIATION DOSE REDUCTION: This exam was performed according to the departmental dose-optimization program which includes automated exposure control, adjustment of the mA and/or kV according to patient size and/or use of iterative reconstruction technique. CONTRAST:  OMNIPAQUE  IOHEXOL  300 MG/ML  SOLN COMPARISON:  None Available. FINDINGS: Lower chest: Heart size is at the upper limits of normal. Otherwise negative visible lower chest. Hepatobiliary: Negative liver and gallbladder. Pancreas: Negative. Spleen: Negative. Adrenals/Urinary Tract: Normal adrenal glands. Right kidney is abutted by the abnormal appendix but remains normal. Left kidney and urinary bladder appear normal. Ureters are decompressed. Stomach/Bowel: Decompressed and negative large bowel from the splenic flexure distally. Small volume layering fluid in the transverse colon. Mostly stool in the right colon. Cecum on a slightly lax mesentery located in the right upper abdomen at the gutter. Appendix arises from the cecum on coronal image 65. Appendix: Location: Medial to the cecum in the right upper abdomen along the anterior right pararenal space,  abutting the kidney. Diameter: Distally up to 13 mm, abnormal. Appendicolith: Multiple. But it appears that a large 10 mm appendicoliths seen on series 2, image 39 and coronal image 56 is obstructing the distal appendix which is inflamed. The base of the appendix remains diminutive and normal. Mucosal hyper-enhancement: Mild Extraluminal gas: Negative. Periappendiceal collection: Negative. Terminal ileum is decompressed. No dilated small bowel. Stomach is decompressed. The proximal duodenum is fluid distended but tapers. This is near the abnormal appendix. No free air or free fluid identified. Vascular/Lymphatic: Major arterial structures and the portal venous system in the abdomen and pelvis appear patent and within normal limits. No lymphadenopathy identified. Reproductive: Negative. Other: No pelvis free  fluid. Musculoskeletal: Negative. IMPRESSION: 1. Acute Appendicitis: multiple appendicoliths, with a large 10 mm stone obstructing the proximal appendix; distal appendix is dilated and inflamed. Note unusual location of the appendix along the anterior right pararenal space, abutting the right kidney. No evidence of perforation or abscess. 2. Right kidney remains normal. No other acute or inflammatory process identified in the abdomen or pelvis. Electronically Signed   By: VEAR Hurst M.D.   On: 07/19/2023 07:39      Assessment/Plan Acute appendicitis with appendicolith  Patient seen and examined and relevant labs and imaging personally reviewed. Work up and history consist with acute appendicitis with appendicoliths. Recommend laparoscopic appendectomy for definitive management I have discussed the procedure and risks of appendectomy. The risks include but are not limited to bleeding, infection, wound problems, anesthesia, injury to intra-abdominal organs, possibility of postoperative ileus. He/she seems to understand and agrees with the plan.  Possibly home from PACU vs overnight observation pending  operative course.  FEN: NPO for OR ID: zosyn  in ED VTE: SCDs   I reviewed ED provider notes, last 24 h vitals and pain scores, last 48 h intake and output, last 24 h labs and trends, and last 24 h imaging results.   [place] VEAR Mais, [facility] 07/19/2023, 10:17 AM Please see Amion for pager number during day hours 7:00am-4:30pm

## 2023-07-19 NOTE — Progress Notes (Signed)
 Pharmacy Antibiotic Note  Johnathan Leonard is a 23 y.o. male admitted on 07/19/2023 with  IAI .  Pharmacy has been consulted for zosyn  dosing.  Plan: Zosyn  3.375g IV q8h (4 hour infusion). Pharmacy to sign off     Temp (24hrs), Avg:98.7 F (37.1 C), Min:98.6 F (37 C), Max:98.8 F (37.1 C)  Recent Labs  Lab 07/19/23 0408  WBC 12.2*  CREATININE 1.20    CrCl cannot be calculated (Unknown ideal weight.).    No Known Allergies   Rosaline IVAR Edison, Pharm.D Use secure chat for questions 07/19/2023 8:18 AM

## 2023-07-19 NOTE — Anesthesia Postprocedure Evaluation (Signed)
 Anesthesia Post Note  Patient: Johnathan Leonard  Procedure(s) Performed: APPENDECTOMY LAPAROSCOPIC     Patient location during evaluation: PACU Anesthesia Type: General Level of consciousness: awake and alert Pain management: pain level controlled Vital Signs Assessment: post-procedure vital signs reviewed and stable Respiratory status: spontaneous breathing, nonlabored ventilation and respiratory function stable Cardiovascular status: blood pressure returned to baseline Postop Assessment: no apparent nausea or vomiting Anesthetic complications: no   No notable events documented.  Last Vitals:  Vitals:   07/19/23 1515 07/19/23 1530  BP: 131/67 126/69  Pulse: 78 75  Resp: (!) 25 20  Temp:  36.9 C  SpO2: 92% 93%    Last Pain:  Vitals:   07/19/23 1530  TempSrc:   PainSc: Asleep                 Vertell Row

## 2023-07-19 NOTE — Anesthesia Procedure Notes (Signed)
 Procedure Name: Intubation Date/Time: 07/19/2023 1:08 PM  Performed by: Delores Duwaine SAUNDERS, CRNAPre-anesthesia Checklist: Patient identified, Emergency Drugs available, Suction available and Patient being monitored Patient Re-evaluated:Patient Re-evaluated prior to induction Oxygen Delivery Method: Circle System Utilized Preoxygenation: Pre-oxygenation with 100% oxygen Induction Type: IV induction and Rapid sequence Laryngoscope Size: Mac and 4 Grade View: Grade I Tube type: Oral Tube size: 7.5 mm Number of attempts: 1 Airway Equipment and Method: Stylet and Oral airway Placement Confirmation: ETT inserted through vocal cords under direct vision, positive ETCO2 and breath sounds checked- equal and bilateral Secured at: 22 cm Tube secured with: Tape Dental Injury: Teeth and Oropharynx as per pre-operative assessment

## 2023-07-20 ENCOUNTER — Encounter (HOSPITAL_COMMUNITY): Payer: Self-pay | Admitting: Surgery

## 2023-07-20 MED ORDER — HEPARIN SODIUM (PORCINE) 5000 UNIT/ML IJ SOLN
5000.0000 [IU] | Freq: Three times a day (TID) | INTRAMUSCULAR | Status: DC
  Administered 2023-07-20 – 2023-07-23 (×9): 5000 [IU] via SUBCUTANEOUS
  Filled 2023-07-20 (×9): qty 1

## 2023-07-20 NOTE — Progress Notes (Addendum)
  Subjective No acute events. Sore but otherwise feeling well. Denies flatus/BM yet. Denies n/v. Pain controlled. Fiancee at bedside  Objective: Vital signs in last 24 hours: Temp:  [98.4 F (36.9 C)-100.6 F (38.1 C)] 99.4 F (37.4 C) (02/08 0922) Pulse Rate:  [57-84] 73 (02/08 0922) Resp:  [15-26] 18 (02/08 0922) BP: (102-137)/(56-79) 126/68 (02/08 0922) SpO2:  [92 %-100 %] 96 % (02/08 0922) Weight:  [87.5 kg] 87.5 kg (02/07 1109) Last BM Date : 07/19/23  Intake/Output from previous day: 02/07 0701 - 02/08 0700 In: 1962.3 [P.O.:250; I.V.:1212.2; IV Piggyback:500.1] Out: 620 [Urine:600; Blood:20] Intake/Output this shift: Total I/O In: -  Out: 700 [Urine:700]  Gen: NAD, comfortable CV: RRR Pulm: Normal work of breathing Abd: Soft, appropriate tenderness; not significantly distended.  Incision is clean/dry without drainage nor erythema Ext: SCDs in place  Lab Results: CBC  Recent Labs    07/19/23 0408  WBC 12.2*  HGB 15.6  HCT 44.9  PLT 196   BMET Recent Labs    07/19/23 0408  NA 135  K 3.7  CL 97*  CO2 25  GLUCOSE 119*  BUN 21*  CREATININE 1.20  CALCIUM 9.7   PT/INR No results for input(s): LABPROT, INR in the last 72 hours. ABG No results for input(s): PHART, HCO3 in the last 72 hours.  Invalid input(s): PCO2, PO2  Studies/Results:  Anti-infectives: Anti-infectives (From admission, onward)    Start     Dose/Rate Route Frequency Ordered Stop   07/19/23 1800  piperacillin -tazobactam (ZOSYN ) IVPB 3.375 g        3.375 g 12.5 mL/hr over 240 Minutes Intravenous Every 8 hours 07/19/23 1752 07/24/23 1759   07/19/23 1400  piperacillin -tazobactam (ZOSYN ) IVPB 3.375 g  Status:  Discontinued        3.375 g 12.5 mL/hr over 240 Minutes Intravenous Every 8 hours 07/19/23 0816 07/19/23 1752   07/19/23 0830  piperacillin -tazobactam (ZOSYN ) IVPB 3.375 g        3.375 g 100 mL/hr over 30 Minutes Intravenous  Once 07/19/23 0815 07/19/23 0930         Assessment/Plan: Patient Active Problem List   Diagnosis Date Noted   Acute appendicitis 07/19/2023   s/p Procedure(s): APPENDECTOMY LAPAROSCOPIC 07/19/2023  - Doing well - Clear liquid diet today, advancing as tolerated - Ambulate 5x/day as able - D/C Foley - IV Zosyn  to continue today may discontinue 2/9 per Dr. Eletha - Spent time with him discussing procedure/findings as noted by Dr. Eletha. All questions answered - PPX: SCDs; I ordered SQH to start today   LOS: 0 days    Lonni Pizza, MD [facility], A [facility]

## 2023-07-20 NOTE — TOC Initial Note (Signed)
 Transition of Care [facility]) - Initial/Assessment Note    Patient Details  Name: Johnathan Leonard MRN: [id number] Date of Birth: 05-09-2001  [facility] CM/SW Contact:    Sonda Manuella Quill, RN Phone Number: 07/20/2023, 9:29 AM  Clinical Narrative:                 Spoke w/ pt in room; pt says he lives at home w/ family; he plans to return at d/c; pt says he will arrange transportation; he denies SDOH risks; pt says he does not have insurance/PCP; he also says he does not have SME, HH services, or home oxygen; he agrees to received resources for [facility], and Social Services; resources placed in d/c instructions; pt also given copies of resources; pt will make his appts a agencies of choice; no TOC needs; please place consult if needed.  Expected Discharge Plan: Home/Self Care Barriers to Discharge: No Barriers Identified   Patient Goals and CMS Choice Patient states their goals for this hospitalization and ongoing recovery are:: home          Expected Discharge Plan and Services   Discharge Planning Services: CM Consult   Living arrangements for the past 2 months: Apartment                 DME Arranged: N/A DME Agency: NA       HH Arranged: NA HH Agency: NA        Prior Living Arrangements/Services Living arrangements for the past 2 months: Apartment Lives with:: Relatives Patient language and need for interpreter reviewed:: Yes Do you feel safe going back to the place where you live?: Yes      Need for Family Participation in Patient Care: Yes (Comment) Care giver support system in place?: Yes (comment) Current home services:  (n/a) Criminal Activity/Legal Involvement Pertinent to Current Situation/Hospitalization: No - Comment as needed  Activities of Daily Living   ADL Screening (condition at time of admission) Independently performs ADLs?: Yes (appropriate for developmental age) Is the patient deaf or have difficulty  hearing?: No Does the patient have difficulty seeing, even when wearing glasses/contacts?: No Does the patient have difficulty concentrating, remembering, or making decisions?: No  Permission Sought/Granted Permission sought to share information with : Case Manager Permission granted to share information with : Yes, Release of Information Signed  Share Information with NAME: Case Manager     Permission granted to share info w Relationship: Jaclyn Nap (mother) [phone]     Emotional Assessment Appearance:: Appears stated age Attitude/Demeanor/Rapport: Gracious Affect (typically observed): Accepting Orientation: : Oriented to Self, Oriented to Place, Oriented to  Time, Oriented to Situation Alcohol / Substance Use: Not Applicable Psych Involvement: No (comment)  Admission diagnosis:  Acute appendicitis with generalized peritonitis without gangrene, perforation, or abscess [K35.200] Acute appendicitis [K35.80] Patient Active Problem List   Diagnosis Date Noted   Acute appendicitis 07/19/2023   PCP:  Patient, No Pcp Per Pharmacy:   [id number] [place], [place] - [address] AT [facility] [address] [place] [address] Phone: [phone] Fax: [phone]  [id number] - [place], [place] - [address] [place] [address] Phone: [phone] Fax: [phone]     Social Drivers of Health (SDOH) Social History: SDOH Screenings   Food Insecurity: No Food Insecurity (07/20/2023)  Housing: Low Risk  (07/20/2023)  Transportation Needs: No Transportation Needs (07/20/2023)  Utilities:  Not At Risk (07/20/2023)  Tobacco Use: Medium Risk (07/19/2023)   SDOH Interventions: Food Insecurity Interventions: Intervention Not Indicated, Inpatient TOC Housing Interventions: Intervention Not Indicated, Inpatient TOC Transportation Interventions: Intervention Not Indicated, Inpatient TOC Utilities  Interventions: Intervention Not Indicated, Inpatient TOC   Readmission Risk Interventions     No data to display

## 2023-07-21 LAB — CBC WITH DIFFERENTIAL/PLATELET
Abs Immature Granulocytes: 0.05 10*3/uL (ref 0.00–0.07)
Basophils Absolute: 0 10*3/uL (ref 0.0–0.1)
Basophils Relative: 0 %
Eosinophils Absolute: 0 10*3/uL (ref 0.0–0.5)
Eosinophils Relative: 0 %
HCT: 42.9 % (ref 39.0–52.0)
Hemoglobin: 14.4 g/dL (ref 13.0–17.0)
Immature Granulocytes: 1 %
Lymphocytes Relative: 30 %
Lymphs Abs: 3 10*3/uL (ref 0.7–4.0)
MCH: 29.4 pg (ref 26.0–34.0)
MCHC: 33.6 g/dL (ref 30.0–36.0)
MCV: 87.7 fL (ref 80.0–100.0)
Monocytes Absolute: 0.9 10*3/uL (ref 0.1–1.0)
Monocytes Relative: 9 %
Neutro Abs: 6.2 10*3/uL (ref 1.7–7.7)
Neutrophils Relative %: 60 %
Platelets: 197 10*3/uL (ref 150–400)
RBC: 4.89 MIL/uL (ref 4.22–5.81)
RDW: 12.3 % (ref 11.5–15.5)
WBC: 10.1 10*3/uL (ref 4.0–10.5)
nRBC: 0 % (ref 0.0–0.2)

## 2023-07-21 LAB — BASIC METABOLIC PANEL
Anion gap: 15 (ref 5–15)
BUN: 23 mg/dL — ABNORMAL HIGH (ref 6–20)
CO2: 25 mmol/L (ref 22–32)
Calcium: 9.4 mg/dL (ref 8.9–10.3)
Chloride: 95 mmol/L — ABNORMAL LOW (ref 98–111)
Creatinine, Ser: 1.35 mg/dL — ABNORMAL HIGH (ref 0.61–1.24)
GFR, Estimated: >60 mL/min (ref >60)
Glucose, Bld: 94 mg/dL (ref 70–99)
Potassium: 4.2 mmol/L (ref 3.5–5.1)
Sodium: 135 mmol/L (ref 135–145)

## 2023-07-21 MED ORDER — LACTATED RINGERS IV BOLUS
1000.0000 mL | Freq: Once | INTRAVENOUS | Status: AC
  Administered 2023-07-21: 1000 mL via INTRAVENOUS

## 2023-07-21 NOTE — Progress Notes (Signed)
  Subjective Febrile yesterday and again low-grade fever overnight last night.  Denies any significant abdominal pain.  Denies any nausea or vomiting.  He has been tolerating liquids without trouble.  He is passing flatus.  Has not had a bowel movement yet.  Objective: Vital signs in last 24 hours: Temp:  [99.2 F (37.3 C)-101.1 F (38.4 C)] 99.3 F (37.4 C) (02/09 0749) Pulse Rate:  [73-82] 78 (02/09 0515) Resp:  [15-18] 16 (02/09 0515) BP: (112-127)/(60-101) 127/66 (02/09 0515) SpO2:  [95 %-97 %] 95 % (02/09 0515) Last BM Date : 07/19/23  Intake/Output from previous day: 02/08 0701 - 02/09 0700 In: 1501.1 [P.O.:1045; I.V.:325.7; IV Piggyback:130.4] Out: 700 [Urine:700] Intake/Output this shift: No intake/output data recorded.  Gen: NAD, comfortable CV: RRR Pulm: Normal work of breathing Abd: Soft, minimal/mild tenderness; not significantly distended.  Incision is clean/dry without drainage nor erythema. No rebound nor guarding Ext: SCDs in place  Lab Results: CBC  Recent Labs    07/19/23 0408  WBC 12.2*  HGB 15.6  HCT 44.9  PLT 196   BMET Recent Labs    07/19/23 0408  NA 135  K 3.7  CL 97*  CO2 25  GLUCOSE 119*  BUN 21*  CREATININE 1.20  CALCIUM 9.7   PT/INR No results for input(s): LABPROT, INR in the last 72 hours. ABG No results for input(s): PHART, HCO3 in the last 72 hours.  Invalid input(s): PCO2, PO2  Studies/Results:  Anti-infectives: Anti-infectives (From admission, onward)    Start     Dose/Rate Route Frequency Ordered Stop   07/19/23 1800  piperacillin -tazobactam (ZOSYN ) IVPB 3.375 g        3.375 g 12.5 mL/hr over 240 Minutes Intravenous Every 8 hours 07/19/23 1752 07/24/23 1759   07/19/23 1400  piperacillin -tazobactam (ZOSYN ) IVPB 3.375 g  Status:  Discontinued        3.375 g 12.5 mL/hr over 240 Minutes Intravenous Every 8 hours 07/19/23 0816 07/19/23 1752   07/19/23 0830  piperacillin -tazobactam (ZOSYN ) IVPB 3.375 g         3.375 g 100 mL/hr over 30 Minutes Intravenous  Once 07/19/23 0815 07/19/23 0930        Assessment/Plan: Patient Active Problem List   Diagnosis Date Noted   Acute appendicitis 07/19/2023   s/p Procedure(s): APPENDECTOMY LAPAROSCOPIC 07/19/2023  - Fever overnight.  Continue IV antibiotics today.  Not unexpected given intraoperative findings and difficult appendectomy.  Abdominal exam remains reassuring. - Check CBC, follow - On full liquids, advance to soft as tolerated - Ambulate 5x/day as able - IV Zosyn  - initially planned d/c today but given recent fever, will continue today. - PPX: SQH, SCDs   LOS: 1 day   Lonni Pizza, MD [facility], A [facility]

## 2023-07-22 LAB — CBC WITH DIFFERENTIAL/PLATELET
Abs Immature Granulocytes: 0.02 10*3/uL (ref 0.00–0.07)
Basophils Absolute: 0 10*3/uL (ref 0.0–0.1)
Basophils Relative: 0 %
Eosinophils Absolute: 0 10*3/uL (ref 0.0–0.5)
Eosinophils Relative: 1 %
HCT: 39.5 % (ref 39.0–52.0)
Hemoglobin: 13.1 g/dL (ref 13.0–17.0)
Immature Granulocytes: 0 %
Lymphocytes Relative: 36 %
Lymphs Abs: 2.3 10*3/uL (ref 0.7–4.0)
MCH: 28.7 pg (ref 26.0–34.0)
MCHC: 33.2 g/dL (ref 30.0–36.0)
MCV: 86.6 fL (ref 80.0–100.0)
Monocytes Absolute: 0.5 10*3/uL (ref 0.1–1.0)
Monocytes Relative: 9 %
Neutro Abs: 3.4 10*3/uL (ref 1.7–7.7)
Neutrophils Relative %: 54 %
Platelets: 182 10*3/uL (ref 150–400)
RBC: 4.56 MIL/uL (ref 4.22–5.81)
RDW: 12.1 % (ref 11.5–15.5)
WBC: 6.3 10*3/uL (ref 4.0–10.5)
nRBC: 0 % (ref 0.0–0.2)

## 2023-07-22 LAB — BASIC METABOLIC PANEL
Anion gap: 14 (ref 5–15)
BUN: 21 mg/dL — ABNORMAL HIGH (ref 6–20)
CO2: 26 mmol/L (ref 22–32)
Calcium: 9.3 mg/dL (ref 8.9–10.3)
Chloride: 97 mmol/L — ABNORMAL LOW (ref 98–111)
Creatinine, Ser: 1.31 mg/dL — ABNORMAL HIGH (ref 0.61–1.24)
GFR, Estimated: >60 mL/min (ref >60)
Glucose, Bld: 81 mg/dL (ref 70–99)
Potassium: 4.1 mmol/L (ref 3.5–5.1)
Sodium: 137 mmol/L (ref 135–145)

## 2023-07-22 LAB — SURGICAL PATHOLOGY

## 2023-07-22 MED ORDER — OXYCODONE HCL 5 MG PO TABS
5.0000 mg | ORAL_TABLET | ORAL | Status: DC | PRN
  Administered 2023-07-22: 5 mg via ORAL
  Filled 2023-07-22: qty 1

## 2023-07-22 MED ORDER — ACETAMINOPHEN 500 MG PO TABS: 1000.0000 mg | ORAL_TABLET | Freq: Four times a day (QID) | ORAL | Status: DC

## 2023-07-22 MED ORDER — HYDROMORPHONE HCL 1 MG/ML IJ SOLN: 1.0000 mg | INTRAMUSCULAR | Status: DC | PRN

## 2023-07-22 MED ORDER — ACETAMINOPHEN 650 MG RE SUPP: 650.0000 mg | Freq: Four times a day (QID) | RECTAL | Status: DC

## 2023-07-22 MED ORDER — METHOCARBAMOL 500 MG PO TABS: 500.0000 mg | ORAL_TABLET | Freq: Four times a day (QID) | ORAL | Status: DC | PRN

## 2023-07-22 MED ORDER — ACETAMINOPHEN 500 MG PO TABS
1000.0000 mg | ORAL_TABLET | Freq: Four times a day (QID) | ORAL | Status: DC
  Administered 2023-07-22 – 2023-07-23 (×4): 1000 mg via ORAL
  Filled 2023-07-22 (×4): qty 2

## 2023-07-22 MED ORDER — SODIUM CHLORIDE 0.9 % IV BOLUS
1000.0000 mL | Freq: Once | INTRAVENOUS | Status: AC
  Administered 2023-07-22: 1000 mL via INTRAVENOUS

## 2023-07-22 NOTE — Plan of Care (Signed)
 ?  Problem: Clinical Measurements: ?Goal: Will remain free from infection ?Outcome: Progressing ?  ?

## 2023-07-22 NOTE — Progress Notes (Signed)
 3 Days Post-Op  Subjective: CC: Right sided pain that is improved and well controlled. Tolerating cld without n/v. On regular diet but has not ordered anything yet because his phone was not working. Passing flatus. BM this am. Voiding without issues. Mobilizing.   Last febrile on 2/9 at 0515 to 101.1. Afebrile over the last 24 hours. No tachycardia or hypotension. WBC 6.3.   Objective: Vital signs in last 24 hours: Temp:  [98.9 F (37.2 C)-99.7 F (37.6 C)] 99.2 F (37.3 C) (02/10 0533) Pulse Rate:  [62-72] 62 (02/10 0533) Resp:  [18-20] 18 (02/10 0533) BP: (118-129)/(62-63) 118/62 (02/10 0533) SpO2:  [95 %-97 %] 96 % (02/10 0533) Weight:  [87.5 kg] 87.5 kg (02/10 0800) Last BM Date : 07/19/23  Intake/Output from previous day: 02/09 0701 - 02/10 0700 In: 169.6 [IV Piggyback:169.6] Out: -  Intake/Output this shift: No intake/output data recorded.  PE: Gen:  Alert, NAD, pleasant Card:  Reg Pulm:  Rate and effort normal Abd: Soft, very mild distension, appropriately tender around incision along with some right sided ttp without rigidity or guarding and otherwise NT, +BS. Laparoscopic incisions with gauze and tegarderm in place, cdi. Midline wound with honeycomb dressing in place cdi Psych: A&Ox3   Lab Results:  Recent Labs    07/21/23 0927 07/22/23 0455  WBC 10.1 6.3  HGB 14.4 13.1  HCT 42.9 39.5  PLT 197 182   BMET Recent Labs    07/21/23 0927  NA 135  K 4.2  CL 95*  CO2 25  GLUCOSE 94  BUN 23*  CREATININE 1.35*  CALCIUM 9.4   PT/INR No results for input(s): "LABPROT", "INR" in the last 72 hours. CMP     Component Value Date/Time   NA 135 07/21/2023 0927   K 4.2 07/21/2023 0927   CL 95 (L) 07/21/2023 0927   CO2 25 07/21/2023 0927   GLUCOSE 94 07/21/2023 0927   BUN 23 (H) 07/21/2023 0927   CREATININE 1.35 (H) 07/21/2023 0927   CALCIUM 9.4 07/21/2023 0927   PROT 9.2 (H) 07/19/2023 0408   ALBUMIN  4.8 07/19/2023 0408   AST 23 07/19/2023 0408    ALT 23 07/19/2023 0408   ALKPHOS 83 07/19/2023 0408   BILITOT 0.8 07/19/2023 0408   GFRNONAA >60 07/21/2023 0927   Lipase     Component Value Date/Time   LIPASE 50 07/19/2023 0408    Studies/Results: No results found.  Anti-infectives: Anti-infectives (From admission, onward)    Start     Dose/Rate Route Frequency Ordered Stop   07/19/23 1800  piperacillin -tazobactam (ZOSYN ) IVPB 3.375 g        3.375 g 12.5 mL/hr over 240 Minutes Intravenous Every 8 hours 07/19/23 1752 07/24/23 1759   07/19/23 1400  piperacillin -tazobactam (ZOSYN ) IVPB 3.375 g  Status:  Discontinued        3.375 g 12.5 mL/hr over 240 Minutes Intravenous Every 8 hours 07/19/23 0816 07/19/23 1752   07/19/23 0830  piperacillin -tazobactam (ZOSYN ) IVPB 3.375 g        3.375 g 100 mL/hr over 30 Minutes Intravenous  Once 07/19/23 0815 07/19/23 0930        Assessment/Plan POD 3 s/p Attempted laparoscopic appendectomy, conversion to open appendectomy by Dr. Sofia Dunn for acute appendicitis on 07/19/23 - Cont abx, plan 5d post op. Fever resolved over the last 24 hours. WBC wnl.  - Mobilize, pulm toilet - BMP pending.   FEN - Reg, SLIV VTE - SCDs, sqh ID - Zosyn  Foley -  none, spont void   LOS: 2 days    Delton Filbert , [facility] 07/22/2023, 10:04 AM Please see Amion for pager number during day hours 7:00am-4:30pm

## 2023-07-23 LAB — BASIC METABOLIC PANEL
Anion gap: 12 (ref 5–15)
BUN: 21 mg/dL — ABNORMAL HIGH (ref 6–20)
CO2: 26 mmol/L (ref 22–32)
Calcium: 9.2 mg/dL (ref 8.9–10.3)
Chloride: 100 mmol/L (ref 98–111)
Creatinine, Ser: 1.18 mg/dL (ref 0.61–1.24)
GFR, Estimated: >60 mL/min (ref >60)
Glucose, Bld: 102 mg/dL — ABNORMAL HIGH (ref 70–99)
Potassium: 4.1 mmol/L (ref 3.5–5.1)
Sodium: 138 mmol/L (ref 135–145)

## 2023-07-23 MED ORDER — AMOXICILLIN-POT CLAVULANATE 875-125 MG PO TABS: 1.0000 | ORAL_TABLET | Freq: Two times a day (BID) | ORAL | 0 refills | Status: DC

## 2023-07-23 MED ORDER — OXYCODONE HCL 5 MG PO TABS: 5.0000 mg | ORAL_TABLET | Freq: Four times a day (QID) | ORAL | 0 refills | Status: AC | PRN

## 2023-07-23 MED ORDER — METHOCARBAMOL 500 MG PO TABS: 500.0000 mg | ORAL_TABLET | Freq: Four times a day (QID) | ORAL | 0 refills | Status: AC | PRN

## 2023-07-23 MED ORDER — ACETAMINOPHEN 500 MG PO TABS: 1000.0000 mg | ORAL_TABLET | Freq: Three times a day (TID) | ORAL | Status: AC | PRN

## 2023-07-23 NOTE — Discharge Summary (Addendum)
Patient ID: Johnathan Leonard [id number] Nov 01, 2000 23 y.o.  Admit date: 07/19/2023 Discharge date: 07/23/2023   Discharge Diagnosis POD 4 s/p Attempted laparoscopic appendectomy, conversion to open appendectomy by Dr. Gerrit Friends for acute appendicitis on 07/19/23   Consultants None  H&P 23 y.o. male without significant medical history who presented to [facility] ED with abdominal pain. Pain began yesterday around noon in the epigastrium. He has had associated subjective fever and nausea and vomiting. He thinks there may have been some blood in his emesis yesterday. Pain has worsened since onset and is now all over but greatest in epigastrium and right abdomen. He has not eaten since prior to pain onset.   Substance use: none Allergies: NKDA Blood thinners: none Past Surgeries: none Works in [employer]    Procedures Dr. Gerrit Friends - 07/19/23 - Attempted laparoscopic appendectomy, conversion to open appendectomy   Hospital Course:  The patient was admitted for acute appendicitis with appendicolith. Taken to the OR and underwent Attempted laparoscopic appendectomy, conversion to open appendectomy by Dr. Gerrit Friends on 07/19/23.   From op note "Attempts at mobilization do not allow for additional visualization as the terminal ileum is also very closely approximated to the appendix. Attempts at mobilizing the cecum do not appear to permit visualization or mobilization of the appendix. Therefore decision is made to convert to open surgery."   Patient tolerated procedure well. He remained on abx post op. Fever resolved. WBC normalized. Diet advanced and tolerated. Patient with return of bowel function. On POD4, the patient was voiding well, tolerating diet, ambulating well, pain well controlled, vital signs stable, incisions c/d/i and felt stable for discharge home. Discussed discharge instructions, restrictions and return/call back precautions. He was d/c home on abx to complete a 5d course.   To note Cr elevated  during admission w/o dx of AKI. Resoled w/ IVF. He should avoid NSAIDs at d/c and f/u with PCP for repeat labs.   Patient works in [employer]. A note was provided for work.   Physical Exam: Gen:  Alert, NAD, pleasant Card:  Reg Pulm:  CTA, b/l. Normal rate and effort.  Abd: Soft, ND, appropriately tender around incision without rigidity or guarding and otherwise NT, +BS. Laparoscopic incisions and midline wound with staples in place, cdi.  Psych: A&Ox3   Allergies as of 07/23/2023   No Known Allergies      Medication List     STOP taking these medications    ibuprofen 200 MG tablet Commonly known as: ADVIL       TAKE these medications    acetaminophen 500 MG tablet Commonly known as: TYLENOL Take 2 tablets (1,000 mg total) by mouth every 8 (eight) hours as needed.   amoxicillin-clavulanate 875-125 MG tablet Commonly known as: AUGMENTIN Take 1 tablet by mouth every 12 (twelve) hours.   methocarbamol 500 MG tablet Commonly known as: ROBAXIN Take 1 tablet (500 mg total) by mouth every 6 (six) hours as needed for muscle spasms.   oxyCODONE 5 MG immediate release tablet Commonly known as: Oxy IR/ROXICODONE Take 1 tablet (5 mg total) by mouth every 6 (six) hours as needed for breakthrough pain.          Follow-up Information     Indalecio Malmstrom, Hedda Slade, [place]. Go on 08/15/2023.   Specialty: General Surgery Why: 3/6 at 1:45 pm. Please arrive 30 minutes early to complete check in, and bring photo ID and insurance card. Contact information: [address] [facility] [place] [address]  [phone]         [facility], [place] Follow up on 08/02/2023.   Specialty: General Surgery Why: 07/23/23 at 10:30am. This is a nurse visit for staple removal. Please arrive 30 minutes prior to your appointment for paperwork. Please bring a copy of your photo ID and insurance card. Contact information: [address] [place]  [address] [phone]         [facility] Follow up.   Why: Your creatinine was elevated during admission. This resolved prior to discharge. Please avoid medications such as NSAIDs (ibuprofen, advil, motrin, BC powders, goody powders, naprosyn, naproxen, mobic, meloxicam). Please follow up with your primary care provider. If you do not have a primary care provider please use the back of your insurance card to call and find a provider in network. If you do not have insurance you can call [facility] to see if they have openings. We would like you to have repeat labs in 1 week. Contact information: [address] [employer] [address] [place] [address] [phone]                Signed: Leary Roca, [facility] Surgery 07/23/2023, 9:46 AM Please see Amion for pager number during day hours 7:00am-4:30pm

## 2023-07-23 NOTE — Progress Notes (Signed)
AVS reviewed w/ pt who verbalized an understanding. No other questions at this time. PIV removed as noted - Med dosing times updated on AVS in pen by this RN. Pt dressing for d/c to home. To lobby via w/c - home w/ a friend & s/o

## 2023-07-27 ENCOUNTER — Encounter (HOSPITAL_COMMUNITY): Payer: Self-pay

## 2023-07-27 ENCOUNTER — Emergency Department (HOSPITAL_COMMUNITY): Payer: Self-pay

## 2023-07-27 ENCOUNTER — Observation Stay (HOSPITAL_COMMUNITY)
Admission: EM | Admit: 2023-07-27 | Discharge: 2023-07-28 | Disposition: A | Discharge status: Home / Self Care | Payer: Self-pay | Attending: General Surgery | Admitting: General Surgery

## 2023-07-27 ENCOUNTER — Other Ambulatory Visit: Payer: Self-pay

## 2023-07-27 ENCOUNTER — Ambulatory Visit (HOSPITAL_COMMUNITY)
Admission: EM | Admit: 2023-07-27 | Discharge: 2023-07-27 | Disposition: A | Discharge status: Home / Self Care | Payer: Self-pay

## 2023-07-27 DIAGNOSIS — K3580 Unspecified acute appendicitis: Secondary | ICD-10-CM | POA: Insufficient documentation

## 2023-07-27 DIAGNOSIS — T8140XA Infection following a procedure, unspecified, initial encounter: Secondary | ICD-10-CM

## 2023-07-27 DIAGNOSIS — Z79899 Other long term (current) drug therapy: Secondary | ICD-10-CM | POA: Insufficient documentation

## 2023-07-27 DIAGNOSIS — Z87891 Personal history of nicotine dependence: Secondary | ICD-10-CM | POA: Insufficient documentation

## 2023-07-27 DIAGNOSIS — T8141XA Infection following a procedure, superficial incisional surgical site, initial encounter: Principal | ICD-10-CM | POA: Insufficient documentation

## 2023-07-27 LAB — CBC WITH DIFFERENTIAL/PLATELET
Abs Immature Granulocytes: 0.08 10*3/uL — ABNORMAL HIGH (ref 0.00–0.07)
Basophils Absolute: 0 10*3/uL (ref 0.0–0.1)
Basophils Relative: 0 %
Eosinophils Absolute: 0 10*3/uL (ref 0.0–0.5)
Eosinophils Relative: 0 %
HCT: 39.7 % (ref 39.0–52.0)
Hemoglobin: 13.8 g/dL (ref 13.0–17.0)
Immature Granulocytes: 1 %
Lymphocytes Relative: 14 %
Lymphs Abs: 1.8 10*3/uL (ref 0.7–4.0)
MCH: 29.2 pg (ref 26.0–34.0)
MCHC: 34.8 g/dL (ref 30.0–36.0)
MCV: 84.1 fL (ref 80.0–100.0)
Monocytes Absolute: 1.1 10*3/uL — ABNORMAL HIGH (ref 0.1–1.0)
Monocytes Relative: 9 %
Neutro Abs: 9.8 10*3/uL — ABNORMAL HIGH (ref 1.7–7.7)
Neutrophils Relative %: 76 %
Platelets: 335 10*3/uL (ref 150–400)
RBC: 4.72 MIL/uL (ref 4.22–5.81)
RDW: 11.8 % (ref 11.5–15.5)
WBC: 12.8 10*3/uL — ABNORMAL HIGH (ref 4.0–10.5)
nRBC: 0 % (ref 0.0–0.2)

## 2023-07-27 LAB — COMPREHENSIVE METABOLIC PANEL
ALT: 43 U/L (ref 0–44)
AST: 20 U/L (ref 15–41)
Albumin: 3.9 g/dL (ref 3.5–5.0)
Alkaline Phosphatase: 61 U/L (ref 38–126)
Anion gap: 12 (ref 5–15)
BUN: 16 mg/dL (ref 6–20)
CO2: 21 mmol/L — ABNORMAL LOW (ref 22–32)
Calcium: 9.6 mg/dL (ref 8.9–10.3)
Chloride: 102 mmol/L (ref 98–111)
Creatinine, Ser: 1.23 mg/dL (ref 0.61–1.24)
GFR, Estimated: >60 mL/min (ref >60)
Glucose, Bld: 101 mg/dL — ABNORMAL HIGH (ref 70–99)
Potassium: 4 mmol/L (ref 3.5–5.1)
Sodium: 135 mmol/L (ref 135–145)
Total Bilirubin: 0.8 mg/dL (ref 0.0–1.2)
Total Protein: 8 g/dL (ref 6.5–8.1)

## 2023-07-27 LAB — HIV ANTIBODY (ROUTINE TESTING W REFLEX): HIV Screen 4th Generation wRfx: NONREACTIVE

## 2023-07-27 LAB — I-STAT CG4 LACTIC ACID, ED: Lactic Acid, Venous: 1.1 mmol/L (ref 0.5–1.9)

## 2023-07-27 MED ORDER — PIPERACILLIN-TAZOBACTAM 3.375 G IVPB 30 MIN
3.3750 g | Freq: Once | INTRAVENOUS | Status: AC
  Administered 2023-07-27: 3.375 g via INTRAVENOUS
  Filled 2023-07-27: qty 50

## 2023-07-27 MED ORDER — HYDROMORPHONE HCL 1 MG/ML IJ SOLN
1.0000 mg | Freq: Once | INTRAMUSCULAR | Status: AC
  Administered 2023-07-27: 1 mg via INTRAVENOUS
  Filled 2023-07-27: qty 1

## 2023-07-27 MED ORDER — SODIUM CHLORIDE 0.9 % IV BOLUS
1000.0000 mL | Freq: Once | INTRAVENOUS | Status: AC
  Administered 2023-07-27: 1000 mL via INTRAVENOUS

## 2023-07-27 MED ORDER — POLYETHYLENE GLYCOL 3350 17 G PO PACK: 17.0000 g | PACK | Freq: Every day | ORAL | Status: DC | PRN

## 2023-07-27 MED ORDER — ENOXAPARIN SODIUM 40 MG/0.4ML IJ SOSY
40.0000 mg | PREFILLED_SYRINGE | INTRAMUSCULAR | Status: DC
  Administered 2023-07-27: 40 mg via SUBCUTANEOUS
  Filled 2023-07-27: qty 0.4

## 2023-07-27 MED ORDER — DOCUSATE SODIUM 100 MG PO CAPS
100.0000 mg | ORAL_CAPSULE | Freq: Two times a day (BID) | ORAL | Status: DC
  Administered 2023-07-27 – 2023-07-28 (×2): 100 mg via ORAL
  Filled 2023-07-27 (×2): qty 1

## 2023-07-27 MED ORDER — ONDANSETRON 4 MG PO TBDP
4.0000 mg | ORAL_TABLET | Freq: Four times a day (QID) | ORAL | Status: DC | PRN
Start: 2023-07-27 — End: 2023-07-28

## 2023-07-27 MED ORDER — ONDANSETRON HCL 4 MG/2ML IJ SOLN: 4.0000 mg | Freq: Four times a day (QID) | INTRAMUSCULAR | Status: DC | PRN

## 2023-07-27 MED ORDER — AMOXICILLIN-POT CLAVULANATE 875-125 MG PO TABS
1.0000 | ORAL_TABLET | Freq: Two times a day (BID) | ORAL | Status: DC
  Administered 2023-07-27 – 2023-07-28 (×2): 1 via ORAL
  Filled 2023-07-27 (×2): qty 1

## 2023-07-27 MED ORDER — OXYCODONE HCL 5 MG PO TABS: 5.0000 mg | ORAL_TABLET | ORAL | Status: DC | PRN

## 2023-07-27 MED ORDER — IBUPROFEN 600 MG PO TABS
600.0000 mg | ORAL_TABLET | Freq: Four times a day (QID) | ORAL | Status: DC
  Administered 2023-07-27 – 2023-07-28 (×4): 600 mg via ORAL
  Filled 2023-07-27 (×4): qty 1

## 2023-07-27 MED ORDER — ACETAMINOPHEN 500 MG PO TABS
1000.0000 mg | ORAL_TABLET | Freq: Four times a day (QID) | ORAL | Status: DC
  Administered 2023-07-27 – 2023-07-28 (×3): 1000 mg via ORAL
  Filled 2023-07-27 (×3): qty 2

## 2023-07-27 MED ORDER — HYDROMORPHONE HCL 1 MG/ML IJ SOLN: 1.0000 mg | INTRAMUSCULAR | Status: DC | PRN

## 2023-07-27 MED ORDER — IOHEXOL 350 MG/ML SOLN
75.0000 mL | Freq: Once | INTRAVENOUS | Status: AC | PRN
Start: 2023-07-27 — End: 2023-07-27
  Administered 2023-07-27: 75 mL via INTRAVENOUS

## 2023-07-27 MED ORDER — SENNA 8.6 MG PO TABS
1.0000 | ORAL_TABLET | Freq: Two times a day (BID) | ORAL | Status: DC
  Administered 2023-07-28: 8.6 mg via ORAL
  Filled 2023-07-27 (×2): qty 1

## 2023-07-27 NOTE — H&P (Signed)
     HPI  Johnathan Leonard is an 23 y.o. male with history of laparoscopic converted to open appendectomy with Dr. Gerrit Friends on 2/7.  Patient was initially doing well but started to feel unwell this AM. Noted bloody purulent drainage from inferior midline incision this AM. Went to UC who sent him to ED for further eval. No fevers/chills. Normal bowel function. No nausea or emesis.  CT scan showed SS fluid at superior aspect of incision with stranding. Leukocytosis to 12.8.  Incision probed at bedside and some purulent drainage noted. Staples removed and and ~20cc of purulent drainage evacuated. Wound irrigated and packed with WTD dressing.  Received zosyn in ED.    10 point review of systems is negative except as listed above in HPI.  Objective  Past Medical History: History reviewed. No pertinent past medical history.  Past Surgical History: Past Surgical History:  Procedure Laterality Date   LAPAROSCOPIC APPENDECTOMY N/A 07/19/2023   Procedure: APPENDECTOMY LAPAROSCOPIC;  Surgeon: Darnell Level, MD;  Location: [facility] ORS;  Service: General;  Laterality: N/A;   WRIST FRACTURE SURGERY Left     Family History:  History reviewed. No pertinent family history.  Social History:  reports that he has quit smoking. His smoking use included cigarettes. He has never been exposed to tobacco smoke. He does not have any smokeless tobacco history on file. He reports current alcohol use. He reports that he does not currently use drugs after having used the following drugs: Cocaine.  Allergies: No Known Allergies  Medications: I have reviewed the patient's current medications.  Labs: I have personally reviewed all labs for the past 24h  Imaging: I have personally reviewed and interpreted all imaging for the past 24h and agree with the radiologist's impression.   Physical Exam Blood pressure 90/66, pulse 87, temperature 98.6 F (37 C), temperature source Oral, resp. rate 16, SpO2 99%. Constitutional:  well-developed, well-nourished HEENT: pupils equal, round, reactive to light, moist conjunctiva, hearing intact Oropharynx: mucous membranes moist CV: Regular rate and rhythm, normotensive Chest: equal chest rise bilaterally normal respiratory effort on room air Abdomen: soft, nondistended, tender to palpation midline, purulence in midline once opened/staples removed fascia intact Extremities: moves all extremities, no peripheral edema Skin: warm, dry, no rashes Psych: normal memory, normal mood/affect  Neuro: No focal neurologic deficits, A&Ox3    Assessment   Johnathan Leonard is an 23 y.o. male who presents to [facility] ED on 07/26/22  due to abdominal pain and midline incisional drainage found to have subcutaneous abscess beneath midline incision  Plan  - Regular diet - Lovenox - Augmentin BID - Midline dressing wet to dry, change BID - Admit for observation. Will re-eval wound in AM and possible dc tomorrow if patient and family comfortable with wound care at home.  I reviewed ED provider notes, last 24 h vitals and pain scores, last 48 h intake and output, last 24 h labs and trends, and last 24 h imaging results.  This care required moderate level of medical decision making.    Donata Duff, MD [facility] Surgery

## 2023-07-27 NOTE — ED Provider Notes (Signed)
 Spanish translator used for this encounter.  Presents to clinic for complaints of postsurgical site drainage.  He has multiple staples placed from a midline incision from an appendectomy done 07/19/23 at [facility].  Reports he noticed some foul smelling drainage from the bottom of his incision site this morning.  Has not had any fevers.  Discussed this is most likely a surgical site infection and that he needs further evaluation at the nearest emergency department.  Patient agreeable to plan, will head to [facility] emergency department.   Johnathan Leonard, [place] N, [place] 07/27/23 1145

## 2023-07-27 NOTE — ED Notes (Signed)
 Trauma Event Note  Reason for Call : Called by Dr Azucena Cecil to assist with bedside wound exploration. Patient post appendectomy on 2/7, presented with purulent drainage. Patient pre-medicated with 1mg  Dilaudid, staples removed by Dr Azucena Cecil. Midline incision irrigated and cleansed, dressing placed. Plans for admission for wound care and antibiotics.  Last imported Vital Signs BP 90/66   Pulse 87   Temp 98.6 F (37 C) (Oral)   Resp 16   SpO2 99%   Trending CBC Recent Labs    07/27/23 1219  WBC 12.8*  HGB 13.8  HCT 39.7  PLT 335   Trending BMET Recent Labs    07/27/23 1219  NA 135  K 4.0  CL 102  CO2 21*  BUN 16  CREATININE 1.23  GLUCOSE 101*   Labaron Digirolamo L Taleeyah Bora  Trauma Response RN  Please call TRN at [phone] for further assistance.

## 2023-07-27 NOTE — Discharge Instructions (Addendum)
 MIDLINE WOUND CARE: - midline dressing to be changed twice daily - supplies: saline, kerlix (Rolled gauze) or gauze, scissors, ABD pads, tape  - remove dressing and all packing carefully - clean edges of skin around the wound with water/gauze, making sure there is no tape debris or leakage left on skin that could cause skin irritation or breakdown. - dampen a clean kerlix or gauze with saline and pack wound from wound base to skin level, making sure to take note of any possible areas of wound tracking, tunneling and packing appropriately. Wound can be packed loosely. Trim kerlix to size if a whole kerlix is not required. - cover wound with a dry ABD pad and secure with tape.  - write the date/time on the dry dressing/tape to better track when the last dressing change occurred. - apply any skin protectant/powder recommended by clinician to protect skin/skin folds. - change dressing as needed if leakage occurs, wound gets contaminated, or patient requests to shower. - patient may shower daily with wound open and following the shower the wound should be dried and a clean dressing placed.

## 2023-07-27 NOTE — ED Triage Notes (Signed)
 Pt had appendectomy on 2/7, noticed bloody purulent drainage this morning. Denies fever/chills. Staples intact

## 2023-07-27 NOTE — ED Provider Triage Note (Signed)
 Emergency Medicine Provider Triage Evaluation Note  Azavier Creson , a 23 y.o. male  was evaluated in triage.  Pt complains of infection to incision   Review of Systems  Positive: Pain and drainage Negative: fever  Physical Exam  BP 90/66   Pulse 87   Temp 98.6 F (37 C) (Oral)   Resp 16   SpO2 99%  Gen:   Awake, no distress   Resp:  Normal effort  MSK:   Moves extremities without difficulty  Other:  Staples in place  purulent bloody drainage   Medical Decision Making  Medically screening exam initiated at 12:18 PM.  Appropriate orders placed.  Harris Penton was informed that the remainder of the evaluation will be completed by another provider, this initial triage assessment does not replace that evaluation, and the importance of remaining in the ED until their evaluation is complete.     Elson Areas, [place] 07/27/23 1219

## 2023-07-27 NOTE — ED Triage Notes (Addendum)
 Pt had an appendectomy on 07/19/2023 and today he woke up and his post operative abdominal surgical site was bleeding. The staples were intact. The drainage (blood) is malodorous.

## 2023-07-27 NOTE — ED Provider Notes (Signed)
 [place] EMERGENCY DEPARTMENT AT [facility] Provider Note   CSN: [id number] Arrival date & time: 07/27/23  1205     History  Chief Complaint  Patient presents with   Post-op Problem    Johnathan Leonard is a 23 y.o. male.  Patient is a 23 year old male who presents to Emergency Department with a chief complaint of pain, bleeding, discharge from his surgical site which began this morning.  Patient did have a laparoscopic, converted to open appendectomy on 2/7.  Patient notes that he was feeling well until this morning.  He was evaluated in urgent care and sent to the emergency department for further evaluation.  Patient notes that he has had no associated fever or chills.  He notes he is still moving his bowels at his baseline.  There has been no associated nausea or vomiting.  He has had no recent falls or blunt trauma to his abdomen.  He has not followed up with his surgeon as of yet.        Home Medications Prior to Admission medications   Medication Sig Start Date End Date Taking? Authorizing Provider  acetaminophen (TYLENOL) 500 MG tablet Take 2 tablets (1,000 mg total) by mouth every 8 (eight) hours as needed. 07/23/23   Maczis, Elmer Sow, PA-C  amoxicillin-clavulanate (AUGMENTIN) 875-125 MG tablet Take 1 tablet by mouth every 12 (twelve) hours. 07/23/23   Maczis, Elmer Sow, PA-C  methocarbamol (ROBAXIN) 500 MG tablet Take 1 tablet (500 mg total) by mouth every 6 (six) hours as needed for muscle spasms. 07/23/23   Maczis, Elmer Sow, PA-C  oxyCODONE (OXY IR/ROXICODONE) 5 MG immediate release tablet Take 1 tablet (5 mg total) by mouth every 6 (six) hours as needed for breakthrough pain. 07/23/23   Maczis, Elmer Sow, PA-C      Allergies    Patient has no known allergies.    Review of Systems   Review of Systems  Gastrointestinal:  Positive for abdominal pain.  Skin:        Discharge from surgical site of abdomen  All other systems reviewed and are negative.   Physical  Exam Updated Vital Signs BP 90/66   Pulse 87   Temp 98.6 F (37 C) (Oral)   Resp 16   SpO2 99%  Physical Exam Vitals reviewed.  Constitutional:      Appearance: Normal appearance.  HENT:     Head: Normocephalic and atraumatic.     Mouth/Throat:     Mouth: Mucous membranes are moist.  Eyes:     Extraocular Movements: Extraocular movements intact.     Conjunctiva/sclera: Conjunctivae normal.     Pupils: Pupils are equal, round, and reactive to light.  Cardiovascular:     Rate and Rhythm: Normal rate and regular rhythm.     Pulses: Normal pulses.     Heart sounds: Normal heart sounds.  Pulmonary:     Effort: Pulmonary effort is normal.     Breath sounds: Normal breath sounds.  Abdominal:     General: Abdomen is flat. Bowel sounds are normal.     Palpations: Abdomen is soft.     Comments: Midline surgical incision of abdomen with tenderness noted, active purulent discharge, active bowel sounds throughout  Musculoskeletal:        General: Normal range of motion.     Cervical back: Normal range of motion and neck supple.  Skin:    General: Skin is warm and dry.  Neurological:     General: No  focal deficit present.     Mental Status: He is alert and oriented to person, place, and time. Mental status is at baseline.  Psychiatric:        Mood and Affect: Mood normal.        Behavior: Behavior normal.        Thought Content: Thought content normal.        Judgment: Judgment normal.     ED Results / Procedures / Treatments   Labs (all labs ordered are listed, but only abnormal results are displayed) Labs Reviewed  COMPREHENSIVE METABOLIC PANEL - Abnormal; Notable for the following components:      Result Value   CO2 21 (*)    Glucose, Bld 101 (*)    All other components within normal limits  CBC WITH DIFFERENTIAL/PLATELET - Abnormal; Notable for the following components:   WBC 12.8 (*)    Neutro Abs 9.8 (*)    Monocytes Absolute 1.1 (*)    Abs Immature Granulocytes  0.08 (*)    All other components within normal limits  CULTURE, BLOOD (ROUTINE X 2)  CULTURE, BLOOD (ROUTINE X 2)  I-STAT CG4 LACTIC ACID, ED    EKG None  Radiology No results found.  Procedures Procedures    Medications Ordered in ED Medications  sodium chloride 0.9 % bolus 1,000 mL (has no administration in time range)    ED Course/ Medical Decision Making/ A&P                                 Medical Decision Making Patient does remain stable at this time.  Patient does have a mild leukocytosis noted on blood work.  CT scan of the abdomen pelvis does demonstrate a fluid collection within the subcutaneous fat as well as some stranding in the anterior mesenteric/omental fat.  This was discussed with Dr. Azucena Cecil with general surgery who notes that she will come to see the patient.  Patient has been given dose of Zosyn in the emergency department as well as IV fluids.  Current blood pressure at this time is 109/67.  Patient has no other acute changes in blood work at this time.  Will sign patient out to oncoming provider Evlyn Kanner, PA-C at 1526 on 07/27/23 pending general surgery exam and recommendations.   Amount and/or Complexity of Data Reviewed Labs: ordered. Radiology: ordered.  Risk Prescription drug management.           Final Clinical Impression(s) / ED Diagnoses Final diagnoses:  None    Rx / DC Orders ED Discharge Orders     None         Kathlen Mody 07/27/23 1528    Eber Hong, MD 07/28/23 [phone]

## 2023-07-27 NOTE — ED Notes (Signed)
 Patient is being discharged from the Urgent Care and sent to the Emergency Department via POV. Per [place] Garrison NP, patient is in need of higher level of care due to post operative infection and post operative surgical site drainage . Patient is aware and verbalizes understanding of plan of care.  Vitals:   07/27/23 1141 07/27/23 1152  BP: (!) 96/51   Pulse: 79   Resp: 18   Temp:  99.2 F (37.3 C)  SpO2: 100%

## 2023-07-27 NOTE — ED Provider Notes (Signed)
 Patient given in sign out by [place]ign[place], PA-C.  Please review their note for patient HPI, physical exam, workup.  At this time the plan is to have general surgery evaluate the patient and determine admission.  General surgery evaluated the patient and will admit patient.  Patient stable to be admitted.   Netta Corrigan, PA-C 07/27/23 1626    Charlynne Pander, MD 07/27/23 [phone]

## 2023-07-27 NOTE — ED Notes (Signed)
 Provider came in to triage patient and they were suggested to go to the ED therefore I did not finish my triage.

## 2023-07-28 MED ORDER — AMOXICILLIN-POT CLAVULANATE 875-125 MG PO TABS: 1.0000 | ORAL_TABLET | Freq: Two times a day (BID) | ORAL | 0 refills | Status: AC

## 2023-07-28 MED ORDER — DOCUSATE SODIUM 100 MG PO CAPS: 100.0000 mg | ORAL_CAPSULE | Freq: Two times a day (BID) | ORAL | 0 refills | Status: AC

## 2023-07-28 MED ORDER — IBUPROFEN 200 MG PO TABS: 600.0000 mg | ORAL_TABLET | Freq: Three times a day (TID) | ORAL | Status: AC | PRN

## 2023-07-28 NOTE — Discharge Summary (Signed)
 [facility] Discharge Summary   Patient ID: Johnathan Leonard MRN: [id number] DOB/AGE: [birth year] 23 y.o.  Admit date: 07/27/2023 Discharge date: 07/28/2023  Admitting Diagnosis: Surgical wound infection  Discharge Diagnosis Patient Active Problem List   Diagnosis Date Noted   Post op infection 07/27/2023   Acute appendicitis 07/19/2023    Consultants None   Imaging: CT ABDOMEN PELVIS W CONTRAST Result Date: 07/27/2023 CLINICAL DATA:  Status post open appendectomy on 07/19/2023. Abdominal pain and possible postoperative infection with drainage from appendectomy incision. EXAM: CT ABDOMEN AND PELVIS WITH CONTRAST TECHNIQUE: Multidetector CT imaging of the abdomen and pelvis was performed using the standard protocol following bolus administration of intravenous contrast. RADIATION DOSE REDUCTION: This exam was performed according to the departmental dose-optimization program which includes automated exposure control, adjustment of the mA and/or kV according to patient size and/or use of iterative reconstruction technique. CONTRAST:  75mL OMNIPAQUE IOHEXOL 350 MG/ML SOLN COMPARISON:  07/19/2023 FINDINGS: Lower chest: No acute abnormality. Hepatobiliary: No focal liver abnormality is seen. No gallstones, gallbladder wall thickening, or biliary dilatation. Pancreas: Unremarkable. No pancreatic ductal dilatation or surrounding inflammatory changes. Spleen: Normal in size without focal abnormality. Adrenals/Urinary Tract: Adrenal glands are unremarkable. Kidneys are normal, without renal calculi, focal lesion, or hydronephrosis. Bladder is unremarkable. Stomach/Bowel: No evidence of bowel obstruction, ileus or free intraperitoneal air. At the level of prior appendectomy, there is no evidence abscess. Vascular/Lymphatic: No significant vascular findings are present. No enlarged abdominal or pelvic lymph nodes. Reproductive: Prostate is unremarkable. Other: Midline laparotomy incision with skin  staples. Just deep to the superior aspect of the incision within the subcutaneous fat, a small amount of serpiginous fluid measures up to 1.4 cm in thickness and 3.8 cm in height. This does not have the appearance of a discrete abscess. Some expected stranding in the subcutaneous fat extends to the abdominal wall. There also is some stranding in the anterior mesenteric/omental fat just deep to the abdominal wall but without associated fluid collection or free fluid. Musculoskeletal: No acute or significant osseous findings. IMPRESSION: 1. Midline laparotomy incision with skin staples. Just deep to the superior aspect of the incision within the subcutaneous fat, a small amount of serpiginous fluid measures up to 1.4 cm in thickness and 3.8 cm in height. This does not have the appearance of a discrete abscess. Some expected stranding in the subcutaneous fat extends to the abdominal wall. There also is some stranding in the anterior mesenteric/omental fat just deep to the abdominal wall but without associated fluid collection or free fluid. 2. No evidence of abscess at the level of prior appendectomy. Electronically Signed   By: Irish Lack M.D.   On: 07/27/2023 14:47    Procedures None   Hospital Course:  Johnathan Leonard is an 23 y.o. male with history of laparoscopic converted to open appendectomy with Dr. Gerrit Friends on 2/7. He came in for bloody drainage and tenderness around his incision. CT scan showed fluid collection. Staples removed and wound infection drained. He received antibiotics and was educated on wet-to-dry wound care. On 2/16 he was stable for discharge.  Physical Exam: General:  Alert, NAD, pleasant, comfortable Abd:  Soft, appropriately tender, midline laparotomy incision with fascia in tact and beefy red wound base there is somee cloudy fluid at wound base. Wound re-packed. There is a LLQ port site with staples as well - no cellulitis   Allergies as of 07/28/2023   No Known Allergies       Medication List  TAKE these medications    acetaminophen 500 MG tablet Commonly known as: TYLENOL Take 2 tablets (1,000 mg total) by mouth every 8 (eight) hours as needed.   amoxicillin-clavulanate 875-125 MG tablet Commonly known as: AUGMENTIN Take 1 tablet by mouth every 12 (twelve) hours for 5 days. What changed: when to take this   docusate sodium 100 MG capsule Commonly known as: COLACE Take 1 capsule (100 mg total) by mouth 2 (two) times daily.   ibuprofen 200 MG tablet Commonly known as: ADVIL Take 3 tablets (600 mg total) by mouth every 8 (eight) hours as needed.   methocarbamol 500 MG tablet Commonly known as: ROBAXIN Take 1 tablet (500 mg total) by mouth every 6 (six) hours as needed for muscle spasms.   oxyCODONE 5 MG immediate release tablet Commonly known as: Oxy IR/ROXICODONE Take 1 tablet (5 mg total) by mouth every 6 (six) hours as needed for breakthrough pain.          Follow-up Information     Darnell Level, MD. Go on 08/15/2023.   Specialty: General Surgery Why: For post-operative follow up, For wound re-check. call to confirm appointment date/time. Contact information: [address] [place] [address] [phone]         [facility], [place]. Go on 08/02/2023.   Specialty: General Surgery Why: for remainder of staples to be removed. for wound check by a nurse. Contact information: [address] [place] [address] [phone]                Signed: Hosie Spangle, [facility] 07/28/2023, 8:35 AM

## 2023-07-28 NOTE — Progress Notes (Signed)
 Mid abdomen wound dressing changed ,demonstrated to patient and family member Myrna, both verbalized understanding of instructions. Wound care supplies provided to patient. All belongings and paperwork given to patient.

## 2023-08-01 LAB — CULTURE, BLOOD (ROUTINE X 2)
Culture: NO GROWTH
Culture: NO GROWTH
